# Patient Record
Sex: Female | Born: 1985 | Race: White | Hispanic: No | Marital: Single | Smoking: Current some day smoker
Health system: Southern US, Community
[De-identification: ages and names within clinical notes are randomized; demographics above are authoritative.]

## PROBLEM LIST (undated history)

## (undated) ENCOUNTER — Inpatient Hospital Stay: Payer: Self-pay

## (undated) DIAGNOSIS — Z803 Family history of malignant neoplasm of breast: Secondary | ICD-10-CM

## (undated) DIAGNOSIS — Z789 Other specified health status: Secondary | ICD-10-CM

## (undated) DIAGNOSIS — N83209 Unspecified ovarian cyst, unspecified side: Secondary | ICD-10-CM

## (undated) HISTORY — DX: Unspecified ovarian cyst, unspecified side: N83.209

## (undated) HISTORY — DX: Family history of malignant neoplasm of breast: Z80.3

## (undated) HISTORY — PX: OVARIAN CYST REMOVAL: SHX89

---
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
## 2007-07-13 ENCOUNTER — Ambulatory Visit: Payer: Self-pay | Admitting: Obstetrics and Gynecology

## 2007-08-16 ENCOUNTER — Emergency Department: Payer: Self-pay | Admitting: Emergency Medicine

## 2007-10-22 ENCOUNTER — Inpatient Hospital Stay: Payer: Self-pay

## 2007-12-16 ENCOUNTER — Observation Stay: Payer: Self-pay | Admitting: Obstetrics and Gynecology

## 2007-12-21 ENCOUNTER — Inpatient Hospital Stay: Payer: Self-pay

## 2008-02-02 ENCOUNTER — Inpatient Hospital Stay: Payer: Self-pay

## 2009-08-22 IMAGING — US US RENAL KIDNEY
1 series · 14 of 25 positions shown · non-contrast
Comparison: none

REASON FOR EXAM: back pain, pyelonephritis/recurrent UTI, pregnant
COMMENTS:

[Series 1: us renal kidney · 0.38mm/px · 14 of 29 slices shown]
[im 1/29]
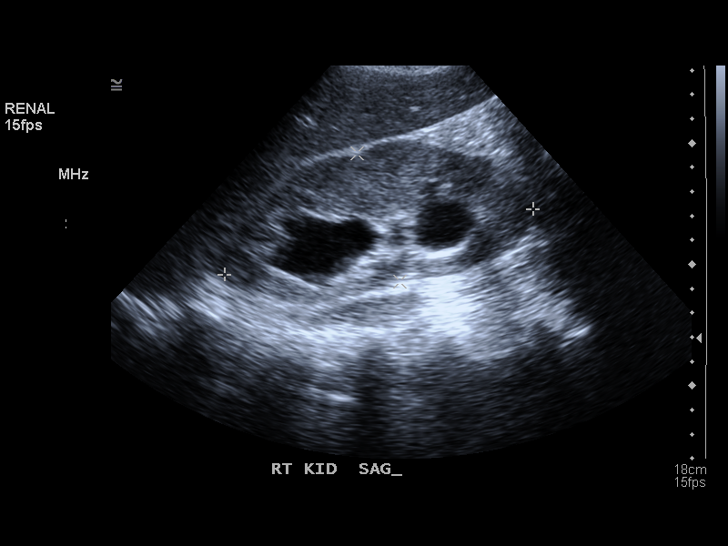
[im 3/29]
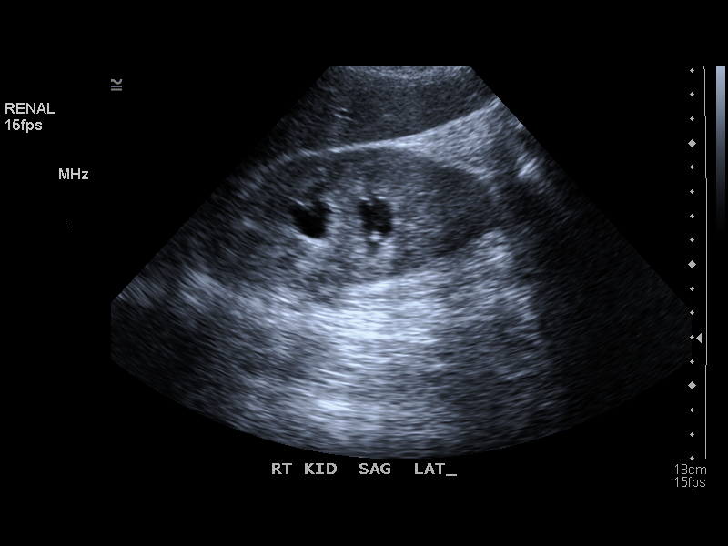
[im 5/29]
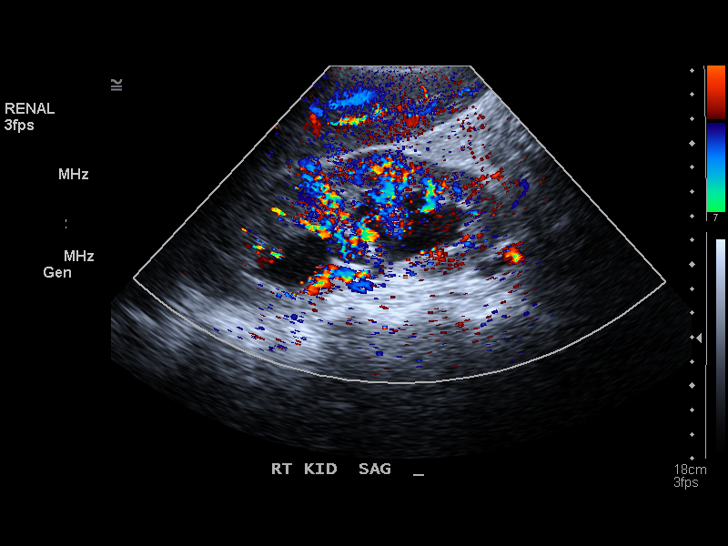
[im 8/29]
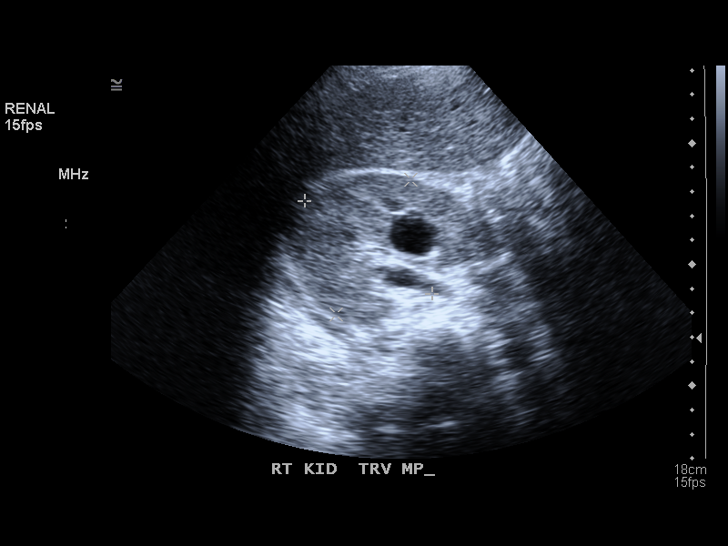
[im 10/29]
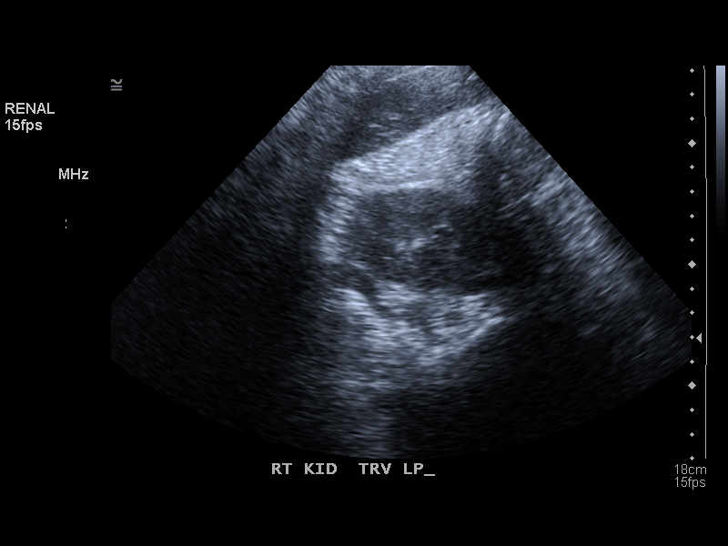
[im 11/29]
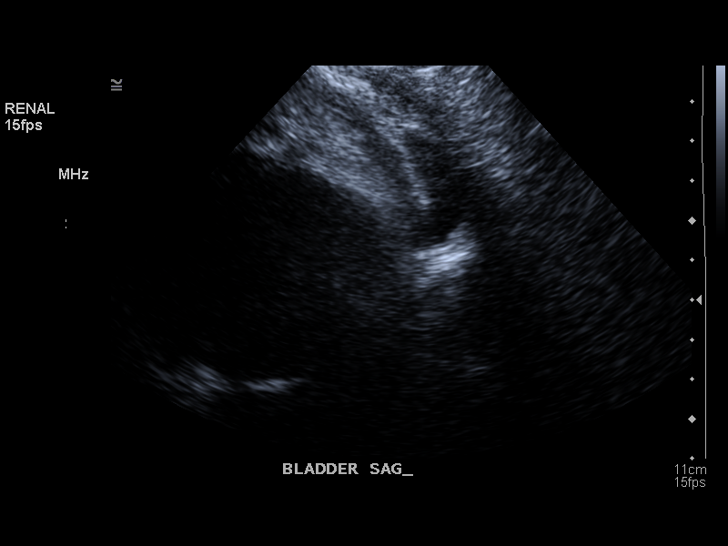
[im 13/29]
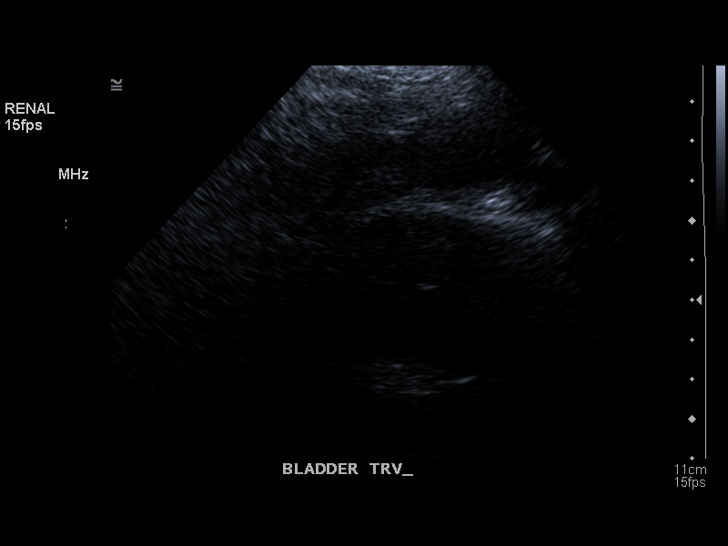
[im 16/29]
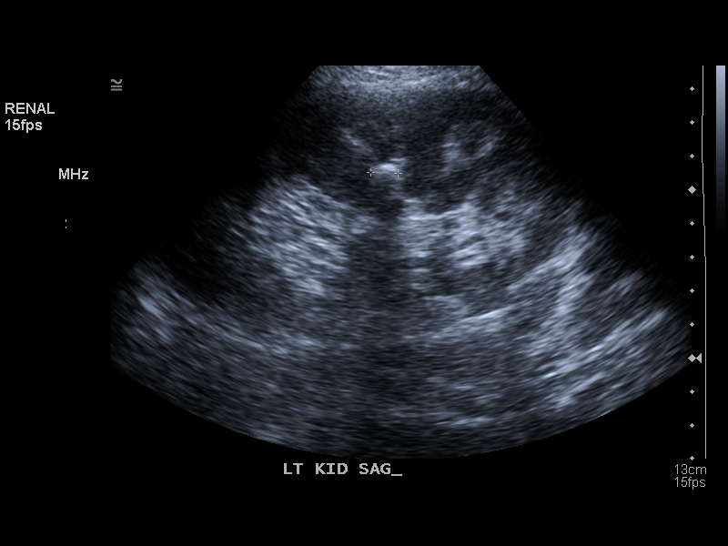
[im 18/29]
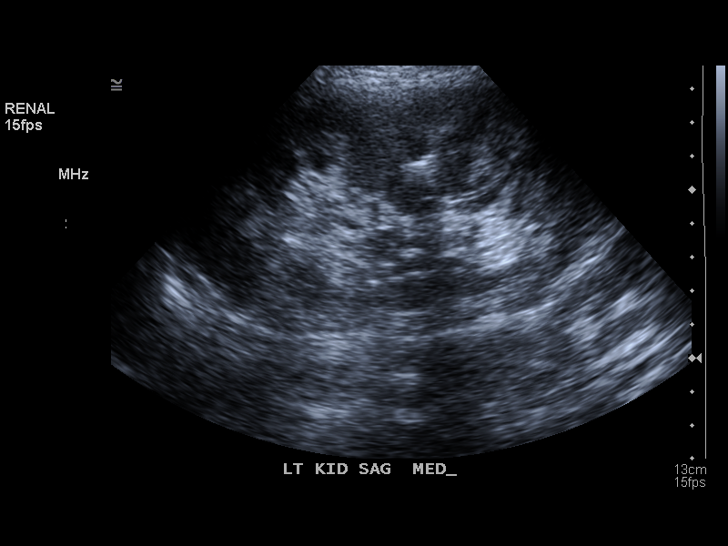
[im 19/29]
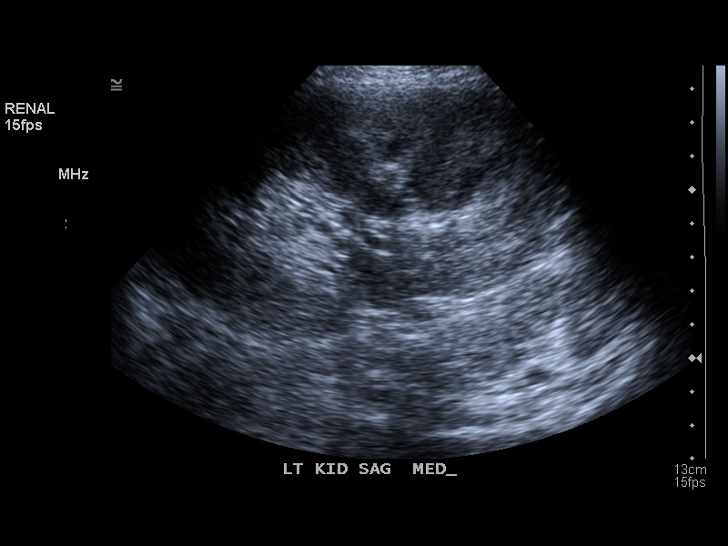
[im 22/29]
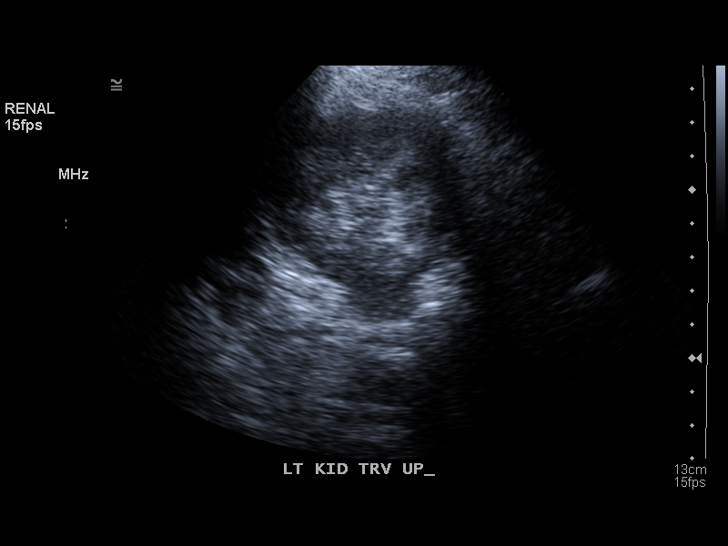
[im 24/29]
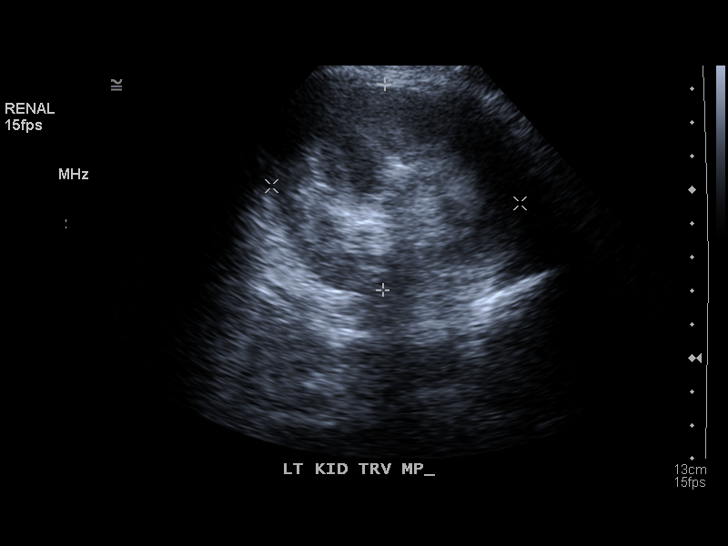
[im 26/29]
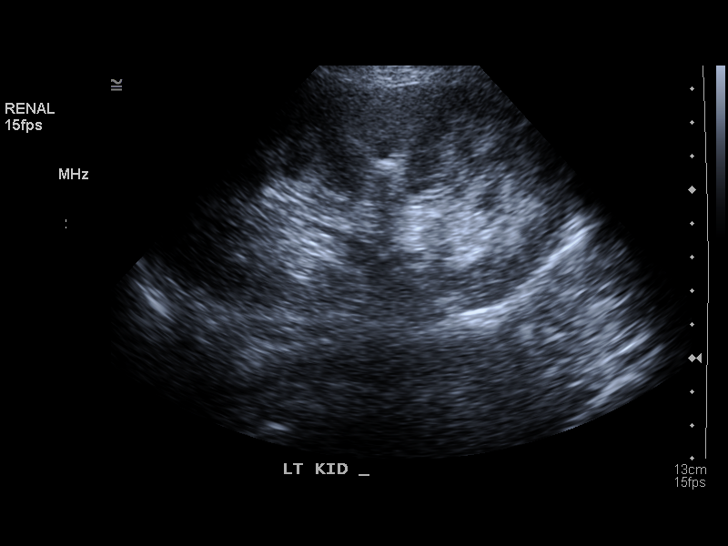
[im 29/29]
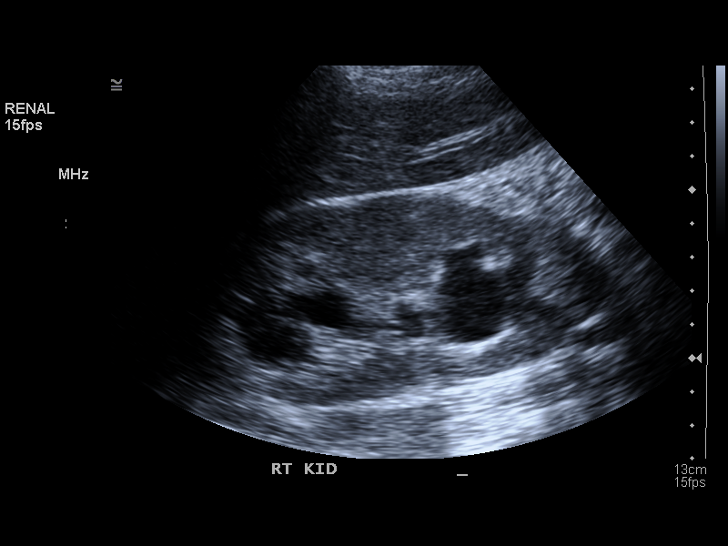

[14 of 25 positions shown; findings below may reference images not displayed]

PROCEDURE:     US  - US KIDNEY  - December 22, 2007 [DATE]

RESULT:     The patient reportedly is 35 weeks pregnant. The RIGHT kidney
measures 13 cm x 5.57 cm x 6.5 cm and the LEFT kidney measures 12.55 cm x
7.21 cm x 6.12 cm. There is moderate hydronephrosis on the RIGHT. This has
developed in the interval since the prior exam of October 26, 2007. No
hydronephrosis is seen on the LEFT. There is noted an echogenicity in the
midpole region of the LEFT kidney suspicious for a nonobstructing LEFT renal
stone.
IMPRESSION: 1. There is moderate hydronephrosis on the RIGHT. The etiology for the
hydronephrosis is not identified but may be hydronephrosis of pregnancy.
2. There is a nonobstructive stone observed in the midpole region of the
LEFT kidney.

## 2011-06-26 ENCOUNTER — Emergency Department: Payer: Self-pay | Admitting: Emergency Medicine

## 2011-06-26 LAB — URINALYSIS, COMPLETE
Bilirubin,UR: NEGATIVE
Glucose,UR: NEGATIVE mg/dL (ref 0–75)
Ph: 8 (ref 4.5–8.0)
Protein: NEGATIVE
Specific Gravity: 1.004 (ref 1.003–1.030)
Squamous Epithelial: 10
WBC UR: 3 /HPF (ref 0–5)

## 2011-06-26 LAB — COMPREHENSIVE METABOLIC PANEL
Albumin: 4 g/dL (ref 3.4–5.0)
Bilirubin,Total: 0.2 mg/dL (ref 0.2–1.0)
Calcium, Total: 9 mg/dL (ref 8.5–10.1)
Chloride: 104 mmol/L (ref 98–107)
Co2: 25 mmol/L (ref 21–32)
Creatinine: 0.84 mg/dL (ref 0.60–1.30)
EGFR (Non-African Amer.): >60
Glucose: 63 mg/dL — ABNORMAL LOW (ref 65–99)
Osmolality: 271 (ref 275–301)
SGOT(AST): 30 U/L (ref 15–37)
SGPT (ALT): 19 U/L
Sodium: 137 mmol/L (ref 136–145)
Total Protein: 8.5 g/dL — ABNORMAL HIGH (ref 6.4–8.2)

## 2011-06-26 LAB — CBC
HGB: 14.9 g/dL (ref 12.0–16.0)
MCHC: 34.1 g/dL (ref 32.0–36.0)
MCV: 91 fL (ref 80–100)
RDW: 13.5 % (ref 11.5–14.5)
WBC: 6.8 10*3/uL (ref 3.6–11.0)

## 2011-06-26 LAB — PREGNANCY, URINE: Pregnancy Test, Urine: NEGATIVE m[IU]/mL

## 2012-05-05 ENCOUNTER — Inpatient Hospital Stay: Payer: Self-pay | Admitting: Obstetrics & Gynecology

## 2012-05-05 LAB — CBC WITH DIFFERENTIAL/PLATELET
Basophil #: 0.1 10*3/uL (ref 0.0–0.1)
HCT: 33.4 % — ABNORMAL LOW (ref 35.0–47.0)
HGB: 11.3 g/dL — ABNORMAL LOW (ref 12.0–16.0)
Lymphocyte %: 15.2 %
MCHC: 33.6 g/dL (ref 32.0–36.0)
Monocyte #: 0.7 x10 3/mm (ref 0.2–0.9)
Monocyte %: 4.9 %
Neutrophil %: 78.7 %
Platelet: 269 10*3/uL (ref 150–440)
RBC: 3.7 10*6/uL — ABNORMAL LOW (ref 3.80–5.20)
RDW: 13.9 % (ref 11.5–14.5)
WBC: 15 10*3/uL — ABNORMAL HIGH (ref 3.6–11.0)

## 2012-05-06 LAB — HEMATOCRIT: HCT: 26.9 % — ABNORMAL LOW (ref 35.0–47.0)

## 2014-07-12 NOTE — H&P (Signed)
L&D Evaluation:  History:  HPI 29 year old G2 P1001 with EDC=05/06/2012 by a 12wk5days presents with LOF since 1 AM. Fluid appears clear with some white specks. No bleeding. She denies regular contractions. Decreased fetal movement x 2 days. [facility] remarkable for early prenatal care, unsure dates, smoking 1/2 PPD, S<D with a 33 week scan showing growth in the 39th %. LABS: O POS, RI, VI, GBS negative. HX of SVD in 2009 delivering a 6#12 oz female   Presents with leaking fluid   Patient's Medical History endometriosis    Patient's Surgical History Laparoscopy    Medications Pre Natal Vitamins    Allergies PCN, Sulfa, PCN-rash and N/V, Sulfa-N/V   Social History tobacco  1/2 PPD, single mother, FOB not involved with care    Family History Significant for breast and ovarian cancer    ROS:  ROS see HPI, otherwise negative   Exam:  Vital Signs stable    Urine Protein not completed   General no apparent distress   Mental Status clear    Chest clear    Heart normal sinus rhythm, no murmur/gallop/rubs   Abdomen gravid, non-tender   Estimated Fetal Weight Average for gestational age, 626-10   Fetal Position cephalic   Edema no edema    Reflexes 1+    Pelvic no external lesions, 1+/50%/-1   Mebranes Ruptured   Description clear, positive Nitrazine and positive fern   FHT baseline in the 130s with accels and moderate variaibility and occasional mild variables with quick rtn to baseline   Ucx occasional   Skin dry   Impression:  Impression IUP at 39 6/7 weeks with PROM x 10 hours   Plan:  Plan EFM/NST, Pitocin induction discussed with patient, explaining purpose and risks including FITL, hyperstimulation, C-section. She agrees with POM.   Electronic Signatures: Trinna BalloonGutierrez, Tayvien Kane L (CNM)  (Signed 04-Mar-14 13:33)  Authored: L&D Evaluation   Last Updated: 04-Mar-14 13:33 by Trinna BalloonGutierrez, Arrie Borrelli L (CNM)

## 2015-04-17 LAB — HM PAP SMEAR: HM Pap smear: NEGATIVE

## 2017-03-04 NOTE — L&D Delivery Note (Addendum)
Delivery Note Primary OB: [facility] Provider: Marcelyn Bruins, CNM Gestational Age: Full term Antepartum complications: post-term, tobacco use, marijuana use and limited prenatal care Intrapartum complications: None  A viable Female was delivered via vertex presentation at 1336. A nuchal cord was present and easily delivered through.  Delivery of the shoulders and body followed without difficulty. The infant was placed on the maternal abdomen. The umbilical cord was doubly clamped and cut following delayed cord clamping.Cord blood was collected. The placenta was delivered spontaneously and was inspected and found to be intact with a three vessel cord. The cervix and vagina were inspected. There were small hemostatic bilateral labial abrasions not requiring repair. The fundus was firm. Patient and infant were bonding in stable condition. All counts were correct.  Apgars: 8, 9  Weight:  7 lb, 11 oz.   Placenta status: spontaneous and Intact.  Cord: 3 vessels;  with the following complications: nuchal.  Anesthesia:  IV sedation Episiotomy:  none Lacerations:  labial Suture Repair: none Est. Blood Loss (mL):  400 mL  Mom to postpartum.  Baby to Couplet care / Skin to Skin.  Marcelyn Bruins, CNM [facility], [facility] 12/08/2017  2:02 PM

## 2017-05-09 LAB — OB RESULTS CONSOLE VARICELLA ZOSTER ANTIBODY, IGG: VARICELLA IGG: IMMUNE

## 2017-05-09 LAB — OB RESULTS CONSOLE ABO/RH: RH TYPE: POSITIVE

## 2017-05-09 LAB — OB RESULTS CONSOLE HGB/HCT, BLOOD
HEMATOCRIT: 38
HEMOGLOBIN: 12.5

## 2017-05-09 LAB — OB RESULTS CONSOLE PLATELET COUNT: Platelets: 291

## 2017-05-09 LAB — OB RESULTS CONSOLE RUBELLA ANTIBODY, IGM: Rubella: IMMUNE

## 2017-05-23 ENCOUNTER — Telehealth: Payer: Self-pay | Admitting: Obstetrics & Gynecology

## 2017-05-23 NOTE — Telephone Encounter (Signed)
We have received records for patient to transfer ob care. Unable to reach patient to schedule. Pt vmb not set up

## 2017-05-26 NOTE — Telephone Encounter (Signed)
Unable to reach patient to schedule. Pt vmb not set up

## 2017-06-02 ENCOUNTER — Ambulatory Visit (INDEPENDENT_AMBULATORY_CARE_PROVIDER_SITE_OTHER): Payer: Medicaid Other | Admitting: Maternal Newborn

## 2017-06-02 ENCOUNTER — Encounter: Payer: Self-pay | Admitting: Maternal Newborn

## 2017-06-02 VITALS — BP 102/62 | HR 88 | Ht 62.5 in | Wt 138.0 lb

## 2017-06-02 DIAGNOSIS — O99330 Smoking (tobacco) complicating pregnancy, unspecified trimester: Secondary | ICD-10-CM | POA: Insufficient documentation

## 2017-06-02 DIAGNOSIS — Z3480 Encounter for supervision of other normal pregnancy, unspecified trimester: Secondary | ICD-10-CM | POA: Insufficient documentation

## 2017-06-02 DIAGNOSIS — Z0189 Encounter for other specified special examinations: Secondary | ICD-10-CM

## 2017-06-02 NOTE — Progress Notes (Signed)
06/02/2017   Chief Complaint: Amenorrhea, positive home pregnancy test, desires prenatal care.  [facility]  History of Present Illness: Cheryl Mccullough is a 32 y.o. G3P2002 at 16w based on Patient's last menstrual period on 02/10/2017 (approximate), with an Estimated Date of Delivery: 11/17/2017, with the above CC.   Her periods were: irregular periods, LMP approximately 02/10/2017 lasting for 2 days, light flow. She was using no method when she conceived.  She has Positive signs or symptoms of nausea/vomiting of pregnancy. She has Negative signs or symptoms of miscarriage or preterm labor She identifies Negative Zika risk factors for her and her partner On any different medications around the time she conceived/early pregnancy: No  History of varicella: Yes   ROS: A 12-point review of systems was performed and negative, except as stated in the above HPI.  OBGYN History: As per HPI. OB History  Gravida Para Term Preterm AB Living  3 2 2     2   SAB TAB Ectopic Multiple Live Births          2    # Outcome Date GA Lbr Len/2nd Weight Sex Delivery Anes PTL Lv  3 Current           2 Term 05/05/12 5766w0d  7 lb 1 oz (3.204 kg) M Vag-Spont   LIV  1 Term 02/03/08 3234w0d  6 lb 12 oz (3.062 kg) M Vag-Spont   LIV    Any issues with any prior pregnancies: yes, recurrent UTI/pyelonephritis in G1. Any prior children are healthy, doing well, without any problems or issues: yes History of pap smears: Yes. Last pap smear 04/17/2015, NILM. History of STIs: No   Past Medical History: Past Medical History:  Diagnosis Date  . Ovarian cyst     Past Surgical History: Past Surgical History:  Procedure Laterality Date  . OVARIAN CYST REMOVAL Right     Family History:  Family History  Problem Relation Age of Onset  . Breast cancer Mother [id number]   She denies any female cancers, bleeding or blood clotting disorders.  She denies any history of  intellectual disability, birth defects or genetic disorders in her or the FOB's history.  Social History:  Social History   Socioeconomic History  . Marital status: Single    Spouse name: Not on file  . Number of children: Not on file  . Years of education: Not on file  . Highest education level: Not on file  Occupational History  . Not on file  Social Needs  . Financial resource strain: Not on file  . Food insecurity:    Worry: Not on file    Inability: Not on file  . Transportation needs:    Medical: Not on file    Non-medical: Not on file  Tobacco Use  . Smoking status: Current Some Day Smoker    Packs/day: 0.50    Types: Cigarettes  . Smokeless tobacco: Never Used  Substance and Sexual Activity  . Alcohol use: Never    Frequency: Never  . Drug use: Never  . Sexual activity: Yes    Birth control/protection: None  Lifestyle  . Physical activity:    Days per week: Not on file    Minutes per session: Not on file  . Stress: Not on file  Relationships  . Social connections:    Talks on phone: Not on file    Gets together: Not on file    Attends religious service:  Not on file    Active member of club or organization: Not on file    Attends meetings of clubs or organizations: Not on file    Relationship status: Not on file  . Intimate partner violence:    Fear of current or ex partner: Not on file    Emotionally abused: Not on file    Physically abused: Not on file    Forced sexual activity: Not on file  Other Topics Concern  . Not on file  Social History Narrative  . Not on file   Any cats in the household: no Denies history of and current domestic violence.  Allergy: Allergies  Allergen Reactions  . Penicillins Other (See Comments)    Other Reaction: Not Assessed  . Sulfa Antibiotics Other (See Comments)    Other Reaction: Not Assessed    Current Outpatient Medications: No current outpatient medications on file.   Physical Exam:   BP 102/62    Pulse 88   Ht 5' 2.5" (1.588 m)   Wt 138 lb (62.6 kg)   LMP 02/10/2017 (Approximate)   BMI 24.84 kg/m  Constitutional: Well nourished, well developed female in no acute distress.  Neck:  Supple, normal appearance, and no thyromegaly  Cardiovascular: S1, S2 normal, no murmur, rub or gallop, regular rate and rhythm Respiratory:  Clear to auscultation bilaterally. Normal respiratory effort Abdomen: gravid, no masses, hernias; diffusely non tender to palpation, non distended Breasts: breasts appear normal, no suspicious masses, no skin or nipple changes or axillary nodes, risk and benefit of breast self-exam was discussed. Neuro/Psych:  Normal mood and affect.  Skin:  Warm and dry.  Lymphatic:  No inguinal lymphadenopathy.   Pelvic exam: is not limited by body habitus External genitalia, Bartholin's glands, Urethra, Skene's glands: within normal limits Vagina: within normal limits and with no blood in the vault  Cervix: normal appearing cervix without discharge or lesions, closed/long/high Uterus:  enlarged: consistent with pregnancy Adnexa:  normal adnexa and no mass, fullness, tenderness  Assessment: Cheryl Mccullough is a 32 y.o. A5W0981 at 23 w based on Patient's last menstrual period on 02/10/2017 (approximate), with an Estimated Date of Delivery: 11/17/2017, presenting for prenatal care.  Plan:  1) Avoid alcoholic beverages. 2) Patient encouraged not to smoke. Currently smoking 0.5 ppd. Reviewed risk in pregnancy and encouraged cessation. Does not desire nicotine patch at this time. 3) Discontinue the use of all non-medicinal drugs and chemicals.  4) Take prenatal vitamins daily.  5) Seatbelt use advised 6) Nutrition, food safety (fish, cheese advisories, and high nitrite foods) and exercise discussed. 7) Hospital and practice style delivering at [facility] discussed  8) Patient is asked about travel to areas at risk for the Zika virus, and counseled to avoid travel and exposure to mosquitoes or  sexual partners who may have themselves been exposed to the virus. Testing is discussed, and will be ordered as appropriate.  9) Childbirth classes at [facility] advised 10) Genetic Screening, such as with 1st Trimester Screening, cell free fetal DNA, AFP testing, and Ultrasound, as well as with amniocentesis and CVS as appropriate, is discussed with patient. She is undecided about genetic testing this pregnancy. 11) Ultrasound ordered for dating and viability. 12) Bonjesta samples and PNV samples given. Patient to call for Rx as desired. 13) Some labs done at prior visit at [facility]. Ordered NOB labs that were not done at that visit for today.  Problem list reviewed and updated.  Return in about 1 day (around  06/03/2017) for ROB following ultrasound.  Marcelyn Bruins, CNM [facility], [facility] 06/02/2017  4:55 PM

## 2017-06-02 NOTE — Progress Notes (Signed)
Pt c/o n/v with no triggers. Pt is unsure exact dates due to hx of irregular periods. [facility]

## 2017-06-03 ENCOUNTER — Other Ambulatory Visit: Payer: Self-pay | Admitting: Maternal Newborn

## 2017-06-03 DIAGNOSIS — Z3480 Encounter for supervision of other normal pregnancy, unspecified trimester: Secondary | ICD-10-CM

## 2017-06-03 LAB — HEPATITIS B SURFACE ANTIGEN: Hepatitis B Surface Ag: NEGATIVE

## 2017-06-03 LAB — HIV ANTIBODY (ROUTINE TESTING W REFLEX): HIV Screen 4th Generation wRfx: NONREACTIVE

## 2017-06-03 LAB — RPR: RPR Ser Ql: NONREACTIVE

## 2017-06-05 LAB — URINE CULTURE

## 2017-06-05 LAB — URINE DRUG PANEL 7
Amphetamines, Urine: NEGATIVE ng/mL
Barbiturate Quant, Ur: NEGATIVE ng/mL
Benzodiazepine Quant, Ur: NEGATIVE ng/mL
CANNABINOID QUANT UR: POSITIVE — AB
COCAINE (METAB.): NEGATIVE ng/mL
OPIATE QUANT UR: NEGATIVE ng/mL
PCP QUANT UR: NEGATIVE ng/mL

## 2017-06-05 LAB — GC/CHLAMYDIA PROBE AMP
Chlamydia trachomatis, NAA: NEGATIVE
Neisseria gonorrhoeae by PCR: NEGATIVE

## 2017-06-06 ENCOUNTER — Encounter: Payer: Medicaid Other | Admitting: Obstetrics and Gynecology

## 2017-06-06 ENCOUNTER — Other Ambulatory Visit: Payer: Medicaid Other

## 2017-06-06 ENCOUNTER — Ambulatory Visit (INDEPENDENT_AMBULATORY_CARE_PROVIDER_SITE_OTHER): Payer: Medicaid Other

## 2017-06-06 ENCOUNTER — Ambulatory Visit (INDEPENDENT_AMBULATORY_CARE_PROVIDER_SITE_OTHER): Payer: Medicaid Other | Admitting: Maternal Newborn

## 2017-06-06 ENCOUNTER — Other Ambulatory Visit: Payer: Self-pay | Admitting: Maternal Newborn

## 2017-06-06 DIAGNOSIS — Z0189 Encounter for other specified special examinations: Secondary | ICD-10-CM

## 2017-06-06 DIAGNOSIS — O219 Vomiting of pregnancy, unspecified: Secondary | ICD-10-CM

## 2017-06-06 DIAGNOSIS — Z3480 Encounter for supervision of other normal pregnancy, unspecified trimester: Secondary | ICD-10-CM

## 2017-06-06 DIAGNOSIS — Z3A14 14 weeks gestation of pregnancy: Secondary | ICD-10-CM

## 2017-06-06 DIAGNOSIS — Z3689 Encounter for other specified antenatal screening: Secondary | ICD-10-CM

## 2017-06-06 MED ORDER — CEPHALEXIN 500 MG PO CAPS: 500.0000 mg | ORAL_CAPSULE | Freq: Two times a day (BID) | ORAL | 2 refills | Status: DC

## 2017-06-06 MED ORDER — DOXYLAMINE-PYRIDOXINE 10-10 MG PO TBEC: 2.0000 | DELAYED_RELEASE_TABLET | Freq: Every day | ORAL | 5 refills | Status: DC

## 2017-06-06 NOTE — Progress Notes (Signed)
No [email or web address] 

## 2017-06-07 ENCOUNTER — Encounter: Payer: Self-pay | Admitting: Maternal Newborn

## 2017-06-07 NOTE — Progress Notes (Signed)
    Routine Prenatal Care Visit  Subjective  Cheryl Mccullough is a 32 y.o. G3P2002 at 420w3d being seen today for ongoing prenatal care.  She is currently monitored for the following issues for this low-risk pregnancy and has Supervision of other normal pregnancy, antepartum and Tobacco use during pregnancy, antepartum on their problem list.  ----------------------------------------------------------------------------------- Patient reports occasional nausea and vomiting. Bonjesta samples worked well. Sent Rx for Diclegis. Contractions: Not present. Vag. Bleeding: None.  Movement: Absent. Denies leaking of fluid.  ----------------------------------------------------------------------------------- The following portions of the patient's history were reviewed and updated as appropriate: allergies, current medications, past family history, past medical history, past social history, past surgical history and problem list. Problem list updated.   Objective  Blood pressure 100/70, weight 137 lb (62.1 kg), last menstrual period 02/10/2017. Pregravid weight 128 lb (58.1 kg) Total Weight Gain 9 lb (4.082 kg) Urinalysis:      Fetal Status: Fetal Heart Rate (bpm): 153   Movement: Absent     General:  Alert, oriented and cooperative. Patient is in no acute distress.  Skin: Skin is warm and dry. No rash noted.   Cardiovascular: Normal heart rate noted  Respiratory: Normal respiratory effort, no problems with respiration noted  Abdomen: Soft, gravid, appropriate for gestational age. Pain/Pressure: Absent     Pelvic:  Cervical exam deferred        Extremities: Normal range of motion.     Mental Status: Normal mood and affect. Normal behavior. Normal judgment and thought content.     Assessment   32 y.o. [id number] at 3120w3d, EDD 12/02/2017 by Ultrasound presenting for routine prenatal visit.  Plan   pregnancy #3 Problems (from 06/02/17 to present)    Problem Noted Resolved   Supervision of other normal  pregnancy, antepartum 06/02/2017 by Oswaldo ConroySchmid, Brittny Spangle Y, CNM No   Overview Addendum 06/03/2017  2:14 PM by Oswaldo ConroySchmid, Akeelah Seppala Y, CNM    Clinic [facility] Prenatal Labs  Dating  Blood type: O/Positive/-- (03/08 0000)   Genetic Screen 1 Screen:    AFP:     Quad:     NIPS: Antibody:   Anatomic US  Rubella: Immune (03/08 0000) Varicella: Immune (03/08 0000)  GTT Early:               Third trimester:  RPR: Non Reactive (04/01 1529)   Rhogam  HBsAg: Negative (04/01 1529)   TDaP vaccine                       Flu Shot: HIV: Non Reactive (04/01 1529)   Baby Food                                GBS:   Contraception  Pap: 04/17/2015, NILM  CBB     CS/VBAC    Support Person                  Ultrasound shows single IUP with cardiac activity. Dating changed today based on this ultrasound dating with 2w 1d difference from LMP.  Gestational age appropriate obstetric precautions were reviewed with the patient.  Return in about 1 month (around 07/04/2017) for ROB and anatomy scan.  Cheryl Mccullough, CNM 06/07/2017  10:35 PM

## 2017-07-04 ENCOUNTER — Ambulatory Visit (INDEPENDENT_AMBULATORY_CARE_PROVIDER_SITE_OTHER): Payer: Medicaid Other

## 2017-07-04 ENCOUNTER — Encounter: Payer: Self-pay | Admitting: Advanced Practice Midwife

## 2017-07-04 ENCOUNTER — Ambulatory Visit (INDEPENDENT_AMBULATORY_CARE_PROVIDER_SITE_OTHER): Payer: Medicaid Other | Admitting: Advanced Practice Midwife

## 2017-07-04 VITALS — BP 110/72 | Wt 136.0 lb

## 2017-07-04 DIAGNOSIS — Z3689 Encounter for other specified antenatal screening: Secondary | ICD-10-CM

## 2017-07-04 DIAGNOSIS — Z0489 Encounter for examination and observation for other specified reasons: Secondary | ICD-10-CM

## 2017-07-04 DIAGNOSIS — IMO0002 Reserved for concepts with insufficient information to code with codable children: Secondary | ICD-10-CM

## 2017-07-04 DIAGNOSIS — Z3A18 18 weeks gestation of pregnancy: Secondary | ICD-10-CM | POA: Diagnosis not present

## 2017-07-04 DIAGNOSIS — Z3480 Encounter for supervision of other normal pregnancy, unspecified trimester: Secondary | ICD-10-CM

## 2017-07-04 NOTE — Progress Notes (Signed)
  Routine Prenatal Care Visit  Subjective  Cheryl Mccullough is a 32 y.o. G3P2002 at [redacted]w[redacted]d being seen today for ongoing prenatal care.  She is currently monitored for the following issues for this low-risk pregnancy and has Supervision of other normal pregnancy, antepartum and Tobacco use during pregnancy, antepartum on their problem list.  ----------------------------------------------------------------------------------- Patient reports no complaints.   Contractions: Not present. Vag. Bleeding: None.  Movement: Present. Denies leaking of fluid.  ----------------------------------------------------------------------------------- The following portions of the patient's history were reviewed and updated as appropriate: allergies, current medications, past family history, past medical history, past social history, past surgical history and problem list. Problem list updated.   Objective  Blood pressure 110/72, weight 136 lb (61.7 kg), last menstrual period 02/10/2017. Pregravid weight 128 lb (58.1 kg) Total Weight Gain 8 lb (3.629 kg) Urinalysis:      Fetal Status: Fetal Heart Rate (bpm): 151   Movement: Present     Anatomy scan today is incomplete for: spine, face, diaphragm, cardiac and sup-optimal for brain structure due to fetal position  General:  Alert, oriented and cooperative. Patient is in no acute distress.  Skin: Skin is warm and dry. No rash noted.   Cardiovascular: Normal heart rate noted  Respiratory: Normal respiratory effort, no problems with respiration noted  Abdomen: Soft, gravid, appropriate for gestational age. Pain/Pressure: Absent     Pelvic:  Cervical exam deferred        Extremities: Normal range of motion.  Edema: None  Mental Status: Normal mood and affect. Normal behavior. Normal judgment and thought content.   Assessment   32 y.o. [id number] at [redacted]w[redacted]d by  12/02/2017, by Ultrasound presenting for routine prenatal visit  Plan   pregnancy #3 Problems (from 06/02/17 to  present)    Problem Noted Resolved   Supervision of other normal pregnancy, antepartum 06/02/2017 by Oswaldo Conroy, CNM No   Overview Addendum 06/03/2017  2:14 PM by Oswaldo Conroy, CNM    Clinic [facility] Prenatal Labs  Dating  Blood type: O/Positive/-- (03/08 0000)   Genetic Screen 1 Screen:    AFP:     Quad:     NIPS: Antibody:   Anatomic [place]  Rubella: Immune (03/08 0000) Varicella: Immune (03/08 0000)  GTT Early:               Third trimester:  RPR: Non Reactive (04/01 1529)   Rhogam  HBsAg: Negative (04/01 1529)   TDaP vaccine                       Flu Shot: HIV: Non Reactive (04/01 1529)   Baby Food                                GBS:   Contraception  Pap: 04/17/2015, NILM  CBB     CS/VBAC    Support Person                  Preterm labor symptoms and general obstetric precautions including but not limited to vaginal bleeding, contractions, leaking of fluid and fetal movement were reviewed in detail with the patient.   Return in about 1 month (around 08/01/2017) for f/u anatomy scan and rob.  Tresea Mall, CNM 07/04/2017 11:14 AM

## 2017-07-04 NOTE — Progress Notes (Signed)
U/s today.  Pt had stomach bug this week. No vb. No lof.

## 2017-08-04 ENCOUNTER — Other Ambulatory Visit: Payer: Medicaid Other

## 2017-08-04 ENCOUNTER — Encounter: Payer: Medicaid Other | Admitting: Maternal Newborn

## 2017-09-02 ENCOUNTER — Other Ambulatory Visit: Payer: Medicaid Other

## 2017-09-02 ENCOUNTER — Encounter: Payer: Medicaid Other | Admitting: Certified Nurse Midwife

## 2017-09-03 ENCOUNTER — Encounter: Payer: Self-pay | Admitting: Obstetrics and Gynecology

## 2017-09-03 ENCOUNTER — Ambulatory Visit (INDEPENDENT_AMBULATORY_CARE_PROVIDER_SITE_OTHER): Payer: Medicaid Other | Admitting: Obstetrics and Gynecology

## 2017-09-03 ENCOUNTER — Ambulatory Visit (INDEPENDENT_AMBULATORY_CARE_PROVIDER_SITE_OTHER): Payer: Medicaid Other

## 2017-09-03 VITALS — BP 100/62 | Wt 147.0 lb

## 2017-09-03 DIAGNOSIS — Z0489 Encounter for examination and observation for other specified reasons: Secondary | ICD-10-CM

## 2017-09-03 DIAGNOSIS — O99330 Smoking (tobacco) complicating pregnancy, unspecified trimester: Secondary | ICD-10-CM

## 2017-09-03 DIAGNOSIS — Z3482 Encounter for supervision of other normal pregnancy, second trimester: Secondary | ICD-10-CM | POA: Diagnosis not present

## 2017-09-03 DIAGNOSIS — F17210 Nicotine dependence, cigarettes, uncomplicated: Secondary | ICD-10-CM

## 2017-09-03 DIAGNOSIS — Z3480 Encounter for supervision of other normal pregnancy, unspecified trimester: Secondary | ICD-10-CM

## 2017-09-03 DIAGNOSIS — Z3A27 27 weeks gestation of pregnancy: Secondary | ICD-10-CM | POA: Diagnosis not present

## 2017-09-03 DIAGNOSIS — O99332 Smoking (tobacco) complicating pregnancy, second trimester: Secondary | ICD-10-CM

## 2017-09-03 NOTE — Progress Notes (Signed)
Routine Prenatal Care Visit  Subjective  Cheryl Mccullough is a 32 y.o. G3P2002 at 5541w1d being seen today for ongoing prenatal care.  She is currently monitored for the following issues for this low-risk pregnancy and has Supervision of other normal pregnancy, antepartum and Tobacco use during pregnancy, antepartum on their problem list.  ----------------------------------------------------------------------------------- Patient reports no complaints.   Contractions: Not present. Vag. Bleeding: None.  Movement: Present. Denies leaking of fluid.  ----------------------------------------------------------------------------------- The following portions of the patient's history were reviewed and updated as appropriate: allergies, current medications, past family history, past medical history, past social history, past surgical history and problem list. Problem list updated.   Objective  Blood pressure 100/62, weight 147 lb (66.7 kg), last menstrual period 02/10/2017. Pregravid weight 128 lb (58.1 kg) Total Weight Gain 19 lb (8.618 kg) Urinalysis: Urine Protein: Negative Urine Glucose: Negative  Fetal Status: Fetal Heart Rate (bpm): 145 Fundal Height: 27 cm Movement: Present     General:  Alert, oriented and cooperative. Patient is in no acute distress.  Skin: Skin is warm and dry. No rash noted.   Cardiovascular: Normal heart rate noted  Respiratory: Normal respiratory effort, no problems with respiration noted  Abdomen: Soft, gravid, appropriate for gestational age. Pain/Pressure: Absent     Pelvic:  Cervical exam deferred        Extremities: Normal range of motion.     ental Status: Normal mood and affect. Normal behavior. Normal judgment and thought content.     Assessment   32 y.o. [id number] at 3241w1d by  12/02/2017, by Ultrasound presenting for routine prenatal visit  Plan   pregnancy #3 Problems (from 06/02/17 to present)    Problem Noted Resolved   Supervision of other normal  pregnancy, antepartum 06/02/2017 by Oswaldo ConroySchmid, Jacelyn Y, CNM No   Overview Addendum 09/03/2017 11:48 AM by Natale MilchSchuman, Micayla Brathwaite R, MD    Clinic [facility] Prenatal Labs  Dating  Blood type: O/Positive/-- (03/08 0000)   Genetic Screen 1 Screen:    AFP:     Quad:     NIPS: Antibody:   Anatomic US  Rubella: Immune (03/08 0000) Varicella: Immune (03/08 0000)  GTT Early:               Third trimester:  RPR: Non Reactive (04/01 1529)   Rhogam  not needed HBsAg: Negative (04/01 1529)   TDaP vaccine                        Flu Shot: HIV: Non Reactive (04/01 1529)   Baby Food                                GBS:   Contraception  Pap: 04/17/2015, NILM  CBB     CS/VBAC    Support Person                  Gestational age appropriate obstetric precautions including but not limited to vaginal bleeding, contractions, leaking of fluid and fetal movement were reviewed in detail with the patient.    Given information on birthcontrol options postpartum. Recommended [email or web address].  She is undecided about method, considering IUD or nexplanon. Discussed breast feeding. Patient is planning on bottle feeding.  Urine culture sent to confirm that she has cleared her previous infection.  Message sent to Dorena DewMarilyn Steele about patient.   Return in about 2 days (around 09/05/2017) for 1 GTT  and ROB.  Adelene Idler MD [facility], [facility] 09/03/17 12:03 PM

## 2017-09-03 NOTE — Progress Notes (Signed)
ROB  °Anatomy scan °

## 2017-09-05 ENCOUNTER — Other Ambulatory Visit: Payer: Self-pay | Admitting: Obstetrics and Gynecology

## 2017-09-05 DIAGNOSIS — N390 Urinary tract infection, site not specified: Secondary | ICD-10-CM

## 2017-09-05 LAB — URINE CULTURE

## 2017-09-05 MED ORDER — NITROFURANTOIN MONOHYD MACRO 100 MG PO CAPS: 100.0000 mg | ORAL_CAPSULE | Freq: Two times a day (BID) | ORAL | 1 refills | Status: DC

## 2017-09-05 NOTE — Progress Notes (Signed)
Pt was not aware she had a rx, so pt told she has one and had a positive urine culture

## 2017-09-05 NOTE — Progress Notes (Signed)
I sent a prescription for her to the pharmacy, but she did not answer. Can you please call her and confirm she picked up the perscription.

## 2017-09-09 ENCOUNTER — Encounter: Payer: Medicaid Other | Admitting: Certified Nurse Midwife

## 2017-09-09 ENCOUNTER — Other Ambulatory Visit: Payer: Medicaid Other

## 2017-11-04 ENCOUNTER — Observation Stay
Admission: EM | Admit: 2017-11-04 | Discharge: 2017-11-04 | Disposition: A | Discharge status: Home / Self Care | Payer: Medicaid Other | Attending: Obstetrics & Gynecology | Admitting: Obstetrics & Gynecology

## 2017-11-04 ENCOUNTER — Other Ambulatory Visit: Payer: Self-pay

## 2017-11-04 DIAGNOSIS — O0933 Supervision of pregnancy with insufficient antenatal care, third trimester: Secondary | ICD-10-CM | POA: Insufficient documentation

## 2017-11-04 DIAGNOSIS — O4703 False labor before 37 completed weeks of gestation, third trimester: Secondary | ICD-10-CM

## 2017-11-04 DIAGNOSIS — Z3A36 36 weeks gestation of pregnancy: Secondary | ICD-10-CM | POA: Diagnosis not present

## 2017-11-04 DIAGNOSIS — Z3A35 35 weeks gestation of pregnancy: Secondary | ICD-10-CM

## 2017-11-04 DIAGNOSIS — Z3480 Encounter for supervision of other normal pregnancy, unspecified trimester: Secondary | ICD-10-CM

## 2017-11-04 DIAGNOSIS — O2343 Unspecified infection of urinary tract in pregnancy, third trimester: Secondary | ICD-10-CM | POA: Diagnosis not present

## 2017-11-04 DIAGNOSIS — O99333 Smoking (tobacco) complicating pregnancy, third trimester: Secondary | ICD-10-CM | POA: Diagnosis not present

## 2017-11-04 LAB — URINE DRUG SCREEN, QUALITATIVE (ARMC ONLY)
Amphetamines, Ur Screen: NOT DETECTED
Barbiturates, Ur Screen: NOT DETECTED
COCAINE METABOLITE, UR ~~LOC~~: NOT DETECTED
Cannabinoid 50 Ng, Ur ~~LOC~~: POSITIVE — AB
MDMA (ECSTASY) UR SCREEN: NOT DETECTED
Methadone Scn, Ur: NOT DETECTED
OPIATE, UR SCREEN: NOT DETECTED
PHENCYCLIDINE (PCP) UR S: NOT DETECTED
TRICYCLIC, UR SCREEN: NOT DETECTED

## 2017-11-04 LAB — OB RESULTS CONSOLE GBS: GBS: NEGATIVE

## 2017-11-04 LAB — GROUP B STREP BY PCR: Group B strep by PCR: NEGATIVE

## 2017-11-04 NOTE — Discharge Instructions (Signed)
Braxton Hicks Contractions °Contractions of the uterus can occur throughout pregnancy, but they are not always a sign that you are in labor. You may have practice contractions called Braxton Hicks contractions. These false labor contractions are sometimes confused with true labor. °What are Braxton Hicks contractions? °Braxton Hicks contractions are tightening movements that occur in the muscles of the uterus before labor. Unlike true labor contractions, these contractions do not result in opening (dilation) and thinning of the cervix. Toward the end of pregnancy (32-34 weeks), Braxton Hicks contractions can happen more often and may become stronger. These contractions are sometimes difficult to tell apart from true labor because they can be very uncomfortable. You should not feel embarrassed if you go to the hospital with false labor. °Sometimes, the only way to tell if you are in true labor is for your health care provider to look for changes in the cervix. The health care provider will do a physical exam and may monitor your contractions. If you are not in true labor, the exam should show that your cervix is not dilating and your water has not broken. °If there are other health problems associated with your pregnancy, it is completely safe for you to be sent home with false labor. You may continue to have Braxton Hicks contractions until you go into true labor. °How to tell the difference between true labor and false labor °True labor °· Contractions last 30-70 seconds. °· Contractions become very regular. °· Discomfort is usually felt in the top of the uterus, and it spreads to the lower abdomen and low back. °· Contractions do not go away with walking. °· Contractions usually become more intense and increase in frequency. °· The cervix dilates and gets thinner. °False labor °· Contractions are usually shorter and not as strong as true labor contractions. °· Contractions are usually irregular. °· Contractions  are often felt in the front of the lower abdomen and in the groin. °· Contractions may go away when you walk around or change positions while lying down. °· Contractions get weaker and are shorter-lasting as time goes on. °· The cervix usually does not dilate or become thin. °Follow these instructions at home: °· Take over-the-counter and prescription medicines only as told by your health care provider. °· Keep up with your usual exercises and follow other instructions from your health care provider. °· Eat and drink lightly if you think you are going into labor. °· If Braxton Hicks contractions are making you uncomfortable: °? Change your position from lying down or resting to walking, or change from walking to resting. °? Sit and rest in a tub of warm water. °? Drink enough fluid to keep your urine pale yellow. Dehydration may cause these contractions. °? Do slow and deep breathing several times an hour. °· Keep all follow-up prenatal visits as told by your health care provider. This is important. °Contact a health care provider if: °· You have a fever. °· You have continuous pain in your abdomen. °Get help right away if: °· Your contractions become stronger, more regular, and closer together. °· You have fluid leaking or gushing from your vagina. °· You pass blood-tinged mucus (bloody show). °· You have bleeding from your vagina. °· You have low back pain that you never had before. °· You feel your baby’s head pushing down and causing pelvic pressure. °· Your baby is not moving inside you as much as it used to. °Summary °· Contractions that occur before labor are called Braxton   Hicks contractions, false labor, or practice contractions. °· Braxton Hicks contractions are usually shorter, weaker, farther apart, and less regular than true labor contractions. True labor contractions usually become progressively stronger and regular and they become more frequent. °· Manage discomfort from Braxton Hicks contractions by  changing position, resting in a warm bath, drinking plenty of water, or practicing deep breathing. °This information is not intended to replace advice given to you by your health care provider. Make sure you discuss any questions you have with your health care provider. °Document Released: 07/04/2016 Document Revised: 07/04/2016 Document Reviewed: 07/04/2016 °Elsevier Interactive Patient Education © 2018 [employer] ° °

## 2017-11-04 NOTE — Discharge Summary (Signed)
Physician Final Progress Note  Patient ID: Cheryl Mccullough MRN: [id number] DOB/AGE: 03/08/1985 32 y.o.  Admit date: 11/04/2017 Admitting provider: Nadara Mustard, MD Discharge date: 11/04/2017   Admission Diagnoses: contractions  Discharge Diagnoses:  Active Problems:   Indication for care in labor and delivery, antepartum 32 yo G3 P2002 at 36 weeks 0 days with infrequent contractions, not in labor, reactive NST, insufficient prenatal care  History of Present Illness: The patient is a 32 y.o. female G3P2002 at [redacted]w[redacted]d who presents for contractions she has had since yesterday. She reports they were every hour yesterday. Since this morning she has been feeling them every 25 minutes. She reports fetal movement. She denies leakage of fluid or vaginal bleeding. She has not been seen at the clinic for the past 2 months  Past Medical History:  Diagnosis Date  . Ovarian cyst     Past Surgical History:  Procedure Laterality Date  . OVARIAN CYST REMOVAL Right     No current facility-administered medications on file prior to encounter.    No current outpatient medications on file prior to encounter.    Allergies  Allergen Reactions  . Penicillins Other (See Comments)    Other Reaction: Not Assessed  . Sulfa Antibiotics Other (See Comments)    Other Reaction: Not Assessed  . Wellbutrin [Bupropion]     Social History   Socioeconomic History  . Marital status: Single    Spouse name: Not on file  . Number of children: Not on file  . Years of education: Not on file  . Highest education level: Not on file  Occupational History  . Not on file  Social Needs  . Financial resource strain: Not on file  . Food insecurity:    Worry: Not on file    Inability: Not on file  . Transportation needs:    Medical: Not on file    Non-medical: Not on file  Tobacco Use  . Smoking status: Current Some Day Smoker    Packs/day: 0.50    Types: Cigarettes  . Smokeless tobacco: Never Used  Substance  and Sexual Activity  . Alcohol use: Never    Frequency: Never  . Drug use: Never  . Sexual activity: Yes    Birth control/protection: Pill  Lifestyle  . Physical activity:    Days per week: Not on file    Minutes per session: Not on file  . Stress: Not on file  Relationships  . Social connections:    Talks on phone: Not on file    Gets together: Not on file    Attends religious service: Not on file    Active member of club or organization: Not on file    Attends meetings of clubs or organizations: Not on file    Relationship status: Not on file  . Intimate partner violence:    Fear of current or ex partner: Not on file    Emotionally abused: Not on file    Physically abused: Not on file    Forced sexual activity: Not on file  Other Topics Concern  . Not on file  Social History Narrative  . Not on file    Physical Exam: BP 117/69 (BP Location: Left Arm)   Pulse 77   Temp 98.1 F (36.7 C) (Oral)   Resp 16   Ht 5\' 3"  (1.6 m)   Wt 67.1 kg   LMP 02/10/2017 (Approximate)   BMI 26.22 kg/m   Gen: NAD CV: RRR Pulm: CTAB Pelvic:  external os FT/thick/-3 per RN Cheryl Mccullough on admission and prior to discharge Toco: rare contractions Fetal well being: 130 bpm baseline, moderate variability, +accelerations, -decelerations Ext: no evidence of DVT  Consults: None  Significant Findings/ Diagnostic Studies: labs:   Results for Cheryl Mccullough (MRN [id number]) as of 11/04/2017 15:20  Ref. Range 11/04/2017 11:56  Amphetamines, Ur Screen Latest Ref Range: NONE DETECTED  NONE DETECTED  Barbiturates, Ur Screen Latest Ref Range: NONE DETECTED  NONE DETECTED  Benzodiazepine, Ur Scrn Latest Ref Range: NONE DETECTED  TEST NOT PERFORMED, REAGENT NOT AVAILABLE (A)  Cocaine Metabolite,Ur [place] Latest Ref Range: NONE DETECTED  NONE DETECTED  Methadone Scn, Ur Latest Ref Range: NONE DETECTED  NONE DETECTED  MDMA (Ecstasy)Ur Screen Latest Ref Range: NONE DETECTED  NONE DETECTED  Cannabinoid 50 Ng, Ur  [place] Latest Ref Range: NONE DETECTED  POSITIVE (A)  Opiate, Ur Screen Latest Ref Range: NONE DETECTED  NONE DETECTED  Phencyclidine (PCP) Ur S Latest Ref Range: NONE DETECTED  NONE DETECTED  Tricyclic, Ur Screen Latest Ref Range: NONE DETECTED  NONE DETECTED  URINE CULTURE Unknown pending   GBS result pending  Procedures: NST  Discharge Condition: good  Disposition: Discharge disposition: 01-Home or Self Care      Diet: Regular diet, encouraged increased hydration  Discharge Activity: Activity as tolerated  Discharge Instructions    Discharge activity:  No Restrictions   Complete by:  As directed    Discharge diet:  No restrictions   Complete by:  As directed    Fetal Kick Count:  Lie on our left side for one hour after a meal, and count the number of times your baby kicks.  If it is less than 5 times, get up, move around and drink some juice.  Repeat the test 30 minutes later.  If it is still less than 5 kicks in an hour, notify your doctor.   Complete by:  As directed    No sexual activity restrictions   Complete by:  As directed    Notify physician for a general feeling that "something is not right"   Complete by:  As directed    Notify physician for increase or change in vaginal discharge   Complete by:  As directed    Notify physician for intestinal cramps, with or without diarrhea, sometimes described as "gas pain"   Complete by:  As directed    Notify physician for leaking of fluid   Complete by:  As directed    Notify physician for low, dull backache, unrelieved by heat or Tylenol   Complete by:  As directed    Notify physician for menstrual like cramps   Complete by:  As directed    Notify physician for pelvic pressure   Complete by:  As directed    Notify physician for uterine contractions.  These may be painless and feel like the uterus is tightening or the baby is  "balling up"   Complete by:  As directed    Notify physician for vaginal bleeding   Complete by:   As directed    PRETERM LABOR:  Includes any of the follwing symptoms that occur between 20 - [redacted] weeks gestation.  If these symptoms are not stopped, preterm labor can result in preterm delivery, placing your baby at risk   Complete by:  As directed      Allergies as of 11/04/2017      Reactions   Penicillins Other (See Comments)   Other Reaction:  Not Assessed   Sulfa Antibiotics Other (See Comments)   Other Reaction: Not Assessed   Wellbutrin [bupropion]       Medication List    You have not been prescribed any medications.    Follow-up Information    [facility]. Schedule an appointment as soon as possible for a visit in 1 week(s).   Specialty:  Obstetrics and Gynecology Why:  for prenatal care Contact information: [address] [place] [address] [phone]          Total time spent taking care of this patient: 20 minutes  Signed: Tresea Mall, CNM  11/04/2017, 3:04 PM

## 2017-11-05 ENCOUNTER — Other Ambulatory Visit: Payer: Self-pay | Admitting: Obstetrics & Gynecology

## 2017-11-05 MED ORDER — NITROFURANTOIN MONOHYD MACRO 100 MG PO CAPS: 100.0000 mg | ORAL_CAPSULE | Freq: Two times a day (BID) | ORAL | 0 refills | Status: DC

## 2017-11-05 NOTE — Progress Notes (Signed)
Mailbox full can not leave message, will try again later

## 2017-11-05 NOTE — Progress Notes (Signed)
Let her know Urine Culture came back Pos for Infection, Rx sent for Macrobid Antibiotic to [facility] to start taking twice a day (today)

## 2017-11-06 LAB — URINE CULTURE: Culture: >=100000 — AB

## 2017-11-07 NOTE — Progress Notes (Signed)
Pt still not available and mail box full

## 2017-11-10 NOTE — Progress Notes (Signed)
Mailbox still full.

## 2017-12-01 ENCOUNTER — Other Ambulatory Visit: Payer: Self-pay

## 2017-12-01 ENCOUNTER — Observation Stay
Admission: EM | Admit: 2017-12-01 | Discharge: 2017-12-01 | Disposition: A | Discharge status: Home / Self Care | Payer: Medicaid Other | Attending: Obstetrics and Gynecology | Admitting: Obstetrics and Gynecology

## 2017-12-01 DIAGNOSIS — F17210 Nicotine dependence, cigarettes, uncomplicated: Secondary | ICD-10-CM | POA: Diagnosis not present

## 2017-12-01 DIAGNOSIS — Z3480 Encounter for supervision of other normal pregnancy, unspecified trimester: Secondary | ICD-10-CM

## 2017-12-01 DIAGNOSIS — O471 False labor at or after 37 completed weeks of gestation: Secondary | ICD-10-CM | POA: Diagnosis present

## 2017-12-01 DIAGNOSIS — O0933 Supervision of pregnancy with insufficient antenatal care, third trimester: Secondary | ICD-10-CM | POA: Insufficient documentation

## 2017-12-01 DIAGNOSIS — O99333 Smoking (tobacco) complicating pregnancy, third trimester: Secondary | ICD-10-CM | POA: Diagnosis not present

## 2017-12-01 DIAGNOSIS — Z3A40 40 weeks gestation of pregnancy: Secondary | ICD-10-CM | POA: Insufficient documentation

## 2017-12-01 NOTE — OB Triage Note (Signed)
Pt complains of contractions for past 3-4 hours, coming eery 40 minutes lasting 45 seconds in length. Denies lof, vaginal bleeding. Baby moving appropriately

## 2017-12-02 NOTE — Discharge Summary (Signed)
See Final Progress Note, 12/01/2017.

## 2017-12-02 NOTE — Final Progress Note (Signed)
Physician Final Progress Note  Patient ID: Cheryl Mccullough MRN: [id number] DOB/AGE: [birth year] 32 y.o.  Admit date: 12/01/2017 Admitting provider: Vena Austria, MD Discharge date: 12/02/2017   Admission Diagnoses: Indication for care in labor and delivery, antepartum  Discharge Diagnoses:  Limited prenatal care  History of Present Illness: The patient is a 32 y.o. female G3P2002 at [redacted]w[redacted]d who presents for some contractions over the last 4 hours, about every 40 minutes lasting 45 seconds. No vaginal bleeding or loss of fluid. Endorses good fetal movement. Has not been seen in the office since July 2019.  Review of Systems: Review of systems negative unless otherwise noted in HPI.   Past Medical History:  Diagnosis Date  . Ovarian cyst     Past Surgical History:  Procedure Laterality Date  . OVARIAN CYST REMOVAL Right     No current facility-administered medications on file prior to encounter.    No current outpatient medications on file prior to encounter.    Allergies  Allergen Reactions  . Penicillins Other (See Comments)    Other Reaction: Not Assessed  . Sulfa Antibiotics Other (See Comments)    Other Reaction: Not Assessed  . Wellbutrin [Bupropion]     Social History   Socioeconomic History  . Marital status: Single    Spouse name: Not on file  . Number of children: Not on file  . Years of education: Not on file  . Highest education level: Not on file  Occupational History  . Not on file  Social Needs  . Financial resource strain: Not on file  . Food insecurity:    Worry: Not on file    Inability: Not on file  . Transportation needs:    Medical: Not on file    Non-medical: Not on file  Tobacco Use  . Smoking status: Current Some Day Smoker    Packs/day: 0.50    Types: Cigarettes  . Smokeless tobacco: Never Used  Substance and Sexual Activity  . Alcohol use: Never    Frequency: Never  . Drug use: Never  . Sexual activity: Yes    Birth  control/protection: Pill  Lifestyle  . Physical activity:    Days per week: Not on file    Minutes per session: Not on file  . Stress: Not on file  Relationships  . Social connections:    Talks on phone: Not on file    Gets together: Not on file    Attends religious service: Not on file    Active member of club or organization: Not on file    Attends meetings of clubs or organizations: Not on file    Relationship status: Not on file  . Intimate partner violence:    Fear of current or ex partner: Not on file    Emotionally abused: Not on file    Physically abused: Not on file    Forced sexual activity: Not on file  Other Topics Concern  . Not on file  Social History Narrative  . Not on file    Family history: Negative/unremarkable except as detailed in HPI. No family history of birth defects.   Physical Exam: BP 136/87 (BP Location: Right Arm)   Pulse 81   Temp 98.1 F (36.7 C) (Oral)   Resp 18   Ht 5\' 2"  (1.575 m)   Wt 66.2 kg   LMP 02/10/2017 (Approximate)   BMI 26.70 kg/m   Gen: NAD CV: Regular rate Pulm: No increased work of breathing Pelvic: 1.5/40/-3 (RN  exam, unchanged > 1 hour later on exam by same RN). Ext: No signs of DVT  NST Baseline: 125 Variability: moderate Accelerations: present Decelerations: absent Tocometry: occasional contractions The patient was monitored for greater than 1 hour, fetal heart rate tracing was deemed reactive.  Consults: None  Significant Findings/ Diagnostic Studies: No labs indicated  Procedures: NST  Discharge Condition: good  Disposition: Discharge home  Diet: Regular diet  Discharge Activity: Activity as tolerated  Discharge Instructions    Discharge activity:  No Restrictions   Complete by:  As directed    Discharge diet:  No restrictions   Complete by:  As directed    Fetal Kick Count:  Lie on our left side for one hour after a meal, and count the number of times your baby kicks.  If it is less than 5  times, get up, move around and drink some juice.  Repeat the test 30 minutes later.  If it is still less than 5 kicks in an hour, notify your doctor.   Complete by:  As directed    LABOR:  When conractions begin, you should start to time them from the beginning of one contraction to the beginning  of the next.  When contractions are 5 - 10 minutes apart or less and have been regular for at least an hour, you should call your health care provider.   Complete by:  As directed    No sexual activity restrictions   Complete by:  As directed    Notify physician for bleeding from the vagina   Complete by:  As directed    Notify physician for blurring of vision or spots before the eyes   Complete by:  As directed    Notify physician for chills or fever   Complete by:  As directed    Notify physician for fainting spells, "black outs" or loss of consciousness   Complete by:  As directed    Notify physician for increase in vaginal discharge   Complete by:  As directed    Notify physician for leaking of fluid   Complete by:  As directed    Notify physician for pain or burning when urinating   Complete by:  As directed    Notify physician for pelvic pressure (sudden increase)   Complete by:  As directed    Notify physician for severe or continued nausea or vomiting   Complete by:  As directed    Notify physician for sudden gushing of fluid from the vagina (with or without continued leaking)   Complete by:  As directed    Notify physician for sudden, constant, or occasional abdominal pain   Complete by:  As directed    Notify physician if baby moving less than usual   Complete by:  As directed      Allergies as of 12/01/2017      Reactions   Penicillins Other (See Comments)   Other Reaction: Not Assessed   Sulfa Antibiotics Other (See Comments)   Other Reaction: Not Assessed   Wellbutrin [bupropion]       Medication List    STOP taking these medications   nitrofurantoin  (macrocrystal-monohydrate) 100 MG capsule Commonly known as:  MACROBID      Reactive NST, no cervical change or other signs of labor. Make ROB appointment.  Signed: Oswaldo Conroy, CNM  12/01/2017

## 2017-12-03 ENCOUNTER — Other Ambulatory Visit: Payer: Self-pay

## 2017-12-03 ENCOUNTER — Observation Stay
Admission: EM | Admit: 2017-12-03 | Discharge: 2017-12-04 | Disposition: A | Discharge status: Home / Self Care | Payer: Medicaid Other | Attending: Obstetrics and Gynecology | Admitting: Obstetrics and Gynecology

## 2017-12-03 ENCOUNTER — Telehealth: Payer: Self-pay

## 2017-12-03 DIAGNOSIS — O26853 Spotting complicating pregnancy, third trimester: Secondary | ICD-10-CM

## 2017-12-03 DIAGNOSIS — Z3A40 40 weeks gestation of pregnancy: Secondary | ICD-10-CM | POA: Insufficient documentation

## 2017-12-03 DIAGNOSIS — F17210 Nicotine dependence, cigarettes, uncomplicated: Secondary | ICD-10-CM | POA: Insufficient documentation

## 2017-12-03 DIAGNOSIS — O4693 Antepartum hemorrhage, unspecified, third trimester: Secondary | ICD-10-CM | POA: Diagnosis present

## 2017-12-03 DIAGNOSIS — Z888 Allergy status to other drugs, medicaments and biological substances status: Secondary | ICD-10-CM | POA: Diagnosis not present

## 2017-12-03 DIAGNOSIS — O99333 Smoking (tobacco) complicating pregnancy, third trimester: Secondary | ICD-10-CM | POA: Insufficient documentation

## 2017-12-03 DIAGNOSIS — Z880 Allergy status to penicillin: Secondary | ICD-10-CM | POA: Diagnosis not present

## 2017-12-03 DIAGNOSIS — Z882 Allergy status to sulfonamides status: Secondary | ICD-10-CM | POA: Insufficient documentation

## 2017-12-03 NOTE — Telephone Encounter (Signed)
If she is having decreased fetal movement than she should go over to the hospital for an NST.

## 2017-12-03 NOTE — Telephone Encounter (Signed)
Pt reports her due date was yesterday. She has been having mild contractions since Sunday. She went to the hospital Monday & they sent her home. She is still having ctx every 30-40 minutes. The baby has slowed down moving but her water hasn't broke. Pt inquiring if she needs to be checked for dilation, go to hospital or wait it out. ZO#[phone]

## 2017-12-03 NOTE — Discharge Summary (Signed)
Physician Final Progress Note  Patient ID: Cheryl Mccullough MRN: [id number] DOB/AGE: [birth year] 32 y.o.  Admit date: 12/03/2017 Admitting provider: Vena Austria, MD Discharge date: 12/03/2017   Admission Diagnoses: vaginal bleeding  Discharge Diagnoses:  Active Problems:   Indication for care in labor and delivery, antepartum Reactive NST, not in labor, no active bleeding  History of Present Illness: The patient is a 32 y.o. female G3P2002 at [redacted]w[redacted]d who presents for vaginal bleeding that occurred earlier this evening. She noticed spots when wiping. She admits intercourse this morning. She denies active bleeding. She denies leakage of fluid. She admits positive fetal movement. She admits contractions every 30 to 40 minutes. She reports feeling frustrated about having pain every 30 to 40 minutes and not being able to sleep. She has missed her recent prenatal appointments and has been seen in triage a couple of days ago. Discussed a plan of care with the patient to return in 5 days for planned induction of labor. Labor precautions reviewed. She is encouraged to pick up some tylenol PM or unisom on the way home so she can get some sleep tonight.   Past Medical History:  Diagnosis Date  . Ovarian cyst     Past Surgical History:  Procedure Laterality Date  . OVARIAN CYST REMOVAL Right     No current facility-administered medications on file prior to encounter.    No current outpatient medications on file prior to encounter.    Allergies  Allergen Reactions  . Penicillins Other (See Comments)    Other Reaction: Not Assessed  . Sulfa Antibiotics Other (See Comments)    Other Reaction: Not Assessed  . Wellbutrin [Bupropion]     Social History   Socioeconomic History  . Marital status: Single    Spouse name: Not on file  . Number of children: Not on file  . Years of education: Not on file  . Highest education level: Not on file  Occupational History  . Not on file  Social  Needs  . Financial resource strain: Not on file  . Food insecurity:    Worry: Not on file    Inability: Not on file  . Transportation needs:    Medical: Not on file    Non-medical: Not on file  Tobacco Use  . Smoking status: Current Some Day Smoker    Packs/day: 0.50    Types: Cigarettes  . Smokeless tobacco: Never Used  Substance and Sexual Activity  . Alcohol use: Never    Frequency: Never  . Drug use: Never  . Sexual activity: Yes    Birth control/protection: Pill  Lifestyle  . Physical activity:    Days per week: Not on file    Minutes per session: Not on file  . Stress: Not on file  Relationships  . Social connections:    Talks on phone: Not on file    Gets together: Not on file    Attends religious service: Not on file    Active member of club or organization: Not on file    Attends meetings of clubs or organizations: Not on file    Relationship status: Not on file  . Intimate partner violence:    Fear of current or ex partner: Not on file    Emotionally abused: Not on file    Physically abused: Not on file    Forced sexual activity: Not on file  Other Topics Concern  . Not on file  Social History Narrative  . Not on file  Physical Exam: BP (!) 125/92 (BP Location: Left Arm)   Pulse 78   Temp 98.2 F (36.8 C) (Oral)   Resp 18   Ht 5\' 2"  (1.575 m)   Wt 66.2 kg   LMP 02/10/2017 (Approximate)   BMI 26.70 kg/m   Gen: NAD CV: RRR Pulm: CTAB Pelvic: posterior and difficult to reach, 1/thick/-3, no blood on exam glove Toco: negative for contractions Fetal well being: 120 bpm baseline, moderate variability, +accelerations, -decelerations Ext: 2+ reflexes  Consults: None  Significant Findings/ Diagnostic Studies: none  Procedures: NST  Discharge Condition: good  Disposition: Discharge disposition: 01-Home or Self Care      Diet: Regular diet  Discharge Activity: Activity as tolerated  Discharge Instructions    Discharge activity:  No  Restrictions   Complete by:  As directed    Discharge diet:  No restrictions   Complete by:  As directed    Fetal Kick Count:  Lie on our left side for one hour after a meal, and count the number of times your baby kicks.  If it is less than 5 times, get up, move around and drink some juice.  Repeat the test 30 minutes later.  If it is still less than 5 kicks in an hour, notify your doctor.   Complete by:  As directed    LABOR:  When conractions begin, you should start to time them from the beginning of one contraction to the beginning  of the next.  When contractions are 5 - 10 minutes apart or less and have been regular for at least an hour, you should call your health care provider.   Complete by:  As directed    No sexual activity restrictions   Complete by:  As directed    Notify physician for bleeding from the vagina   Complete by:  As directed    Notify physician for blurring of vision or spots before the eyes   Complete by:  As directed    Notify physician for chills or fever   Complete by:  As directed    Notify physician for fainting spells, "black outs" or loss of consciousness   Complete by:  As directed    Notify physician for increase in vaginal discharge   Complete by:  As directed    Notify physician for leaking of fluid   Complete by:  As directed    Notify physician for pain or burning when urinating   Complete by:  As directed    Notify physician for pelvic pressure (sudden increase)   Complete by:  As directed    Notify physician for severe or continued nausea or vomiting   Complete by:  As directed    Notify physician for sudden gushing of fluid from the vagina (with or without continued leaking)   Complete by:  As directed    Notify physician for sudden, constant, or occasional abdominal pain   Complete by:  As directed    Notify physician if baby moving less than usual   Complete by:  As directed      Allergies as of 12/03/2017      Reactions   Penicillins  Other (See Comments)   Other Reaction: Not Assessed   Sulfa Antibiotics Other (See Comments)   Other Reaction: Not Assessed   Wellbutrin [bupropion]       Medication List    You have not been prescribed any medications.      Total time spent taking care  of this patient: 15 minutes  Signed: Tresea Mall, CNM  12/03/2017, 11:28 PM

## 2017-12-03 NOTE — Telephone Encounter (Signed)
Pt aware & will report to ER for check in to L&D

## 2017-12-08 ENCOUNTER — Inpatient Hospital Stay
Admission: EM | Admit: 2017-12-08 | Discharge: 2017-12-09 | DRG: 806 | Disposition: A | Discharge status: Home / Self Care | Payer: Medicaid Other | Attending: Maternal Newborn | Admitting: Maternal Newborn

## 2017-12-08 ENCOUNTER — Other Ambulatory Visit: Payer: Self-pay

## 2017-12-08 DIAGNOSIS — O9902 Anemia complicating childbirth: Secondary | ICD-10-CM | POA: Diagnosis present

## 2017-12-08 DIAGNOSIS — F17210 Nicotine dependence, cigarettes, uncomplicated: Secondary | ICD-10-CM

## 2017-12-08 DIAGNOSIS — Z3A40 40 weeks gestation of pregnancy: Secondary | ICD-10-CM

## 2017-12-08 DIAGNOSIS — O99324 Drug use complicating childbirth: Secondary | ICD-10-CM | POA: Diagnosis present

## 2017-12-08 DIAGNOSIS — F1290 Cannabis use, unspecified, uncomplicated: Secondary | ICD-10-CM | POA: Diagnosis present

## 2017-12-08 DIAGNOSIS — D649 Anemia, unspecified: Secondary | ICD-10-CM | POA: Diagnosis present

## 2017-12-08 DIAGNOSIS — O480 Post-term pregnancy: Principal | ICD-10-CM | POA: Diagnosis present

## 2017-12-08 DIAGNOSIS — O99334 Smoking (tobacco) complicating childbirth: Secondary | ICD-10-CM | POA: Diagnosis present

## 2017-12-08 LAB — TYPE AND SCREEN
ABO/RH(D): O POS
Antibody Screen: NEGATIVE

## 2017-12-08 LAB — URINE DRUG SCREEN, QUALITATIVE (ARMC ONLY)
AMPHETAMINES, UR SCREEN: NOT DETECTED
Barbiturates, Ur Screen: NOT DETECTED
Benzodiazepine, Ur Scrn: NOT DETECTED
COCAINE METABOLITE, UR ~~LOC~~: NOT DETECTED
Cannabinoid 50 Ng, Ur ~~LOC~~: POSITIVE — AB
MDMA (Ecstasy)Ur Screen: NOT DETECTED
Methadone Scn, Ur: NOT DETECTED
Opiate, Ur Screen: NOT DETECTED
PHENCYCLIDINE (PCP) UR S: NOT DETECTED
Tricyclic, Ur Screen: NOT DETECTED

## 2017-12-08 LAB — CBC
HCT: 29.9 % — ABNORMAL LOW (ref 35.0–47.0)
Hemoglobin: 10.2 g/dL — ABNORMAL LOW (ref 12.0–16.0)
MCH: 30.4 pg (ref 26.0–34.0)
MCHC: 34.3 g/dL (ref 32.0–36.0)
MCV: 88.8 fL (ref 80.0–100.0)
PLATELETS: 238 10*3/uL (ref 150–440)
RBC: 3.36 MIL/uL — ABNORMAL LOW (ref 3.80–5.20)
RDW: 14.3 % (ref 11.5–14.5)
WBC: 10.7 10*3/uL (ref 3.6–11.0)

## 2017-12-08 LAB — CHLAMYDIA/NGC RT PCR (ARMC ONLY)
Chlamydia Tr: NOT DETECTED
N gonorrhoeae: NOT DETECTED

## 2017-12-08 LAB — HEMOGLOBIN A1C
HEMOGLOBIN A1C: 5 % (ref 4.8–5.6)
Mean Plasma Glucose: 96.8 mg/dL

## 2017-12-08 MED ORDER — CALCIUM CARBONATE ANTACID 500 MG PO CHEW
400.0000 mg | CHEWABLE_TABLET | Freq: Three times a day (TID) | ORAL | Status: DC
  Administered 2017-12-08: 400 mg via ORAL
  Filled 2017-12-08: qty 2

## 2017-12-08 MED ORDER — IBUPROFEN 600 MG PO TABS
600.0000 mg | ORAL_TABLET | Freq: Four times a day (QID) | ORAL | Status: DC
  Administered 2017-12-08 – 2017-12-09 (×4): 600 mg via ORAL
  Filled 2017-12-08 (×4): qty 1

## 2017-12-08 MED ORDER — LIDOCAINE HCL (PF) 1 % IJ SOLN
INTRAMUSCULAR | Status: AC
  Filled 2017-12-08: qty 30

## 2017-12-08 MED ORDER — SIMETHICONE 80 MG PO CHEW: 80.0000 mg | CHEWABLE_TABLET | ORAL | Status: DC | PRN

## 2017-12-08 MED ORDER — ACETAMINOPHEN 325 MG PO TABS: 650.0000 mg | ORAL_TABLET | ORAL | Status: DC | PRN

## 2017-12-08 MED ORDER — AMMONIA AROMATIC IN INHA
RESPIRATORY_TRACT | Status: AC
  Filled 2017-12-08: qty 10

## 2017-12-08 MED ORDER — COCONUT OIL OIL: 1.0000 "application " | TOPICAL_OIL | Status: DC | PRN

## 2017-12-08 MED ORDER — LIDOCAINE HCL (PF) 1 % IJ SOLN: 30.0000 mL | INTRAMUSCULAR | Status: DC | PRN

## 2017-12-08 MED ORDER — OXYTOCIN BOLUS FROM INFUSION: 500.0000 mL | Freq: Once | INTRAVENOUS | Status: DC

## 2017-12-08 MED ORDER — ONDANSETRON HCL 4 MG/2ML IJ SOLN: 4.0000 mg | Freq: Four times a day (QID) | INTRAMUSCULAR | Status: DC | PRN

## 2017-12-08 MED ORDER — LACTATED RINGERS IV SOLN
INTRAVENOUS | Status: DC
  Administered 2017-12-08: 125 mL/h via INTRAVENOUS

## 2017-12-08 MED ORDER — ONDANSETRON HCL 4 MG PO TABS: 4.0000 mg | ORAL_TABLET | ORAL | Status: DC | PRN

## 2017-12-08 MED ORDER — OXYCODONE-ACETAMINOPHEN 5-325 MG PO TABS: 2.0000 | ORAL_TABLET | ORAL | Status: DC | PRN

## 2017-12-08 MED ORDER — BUTORPHANOL TARTRATE 1 MG/ML IJ SOLN
1.0000 mg | INTRAMUSCULAR | Status: DC | PRN
  Administered 2017-12-08 (×2): 1 mg via INTRAVENOUS
  Filled 2017-12-08 (×2): qty 1

## 2017-12-08 MED ORDER — TETANUS-DIPHTH-ACELL PERTUSSIS 5-2.5-18.5 LF-MCG/0.5 IM SUSP: 0.5000 mL | Freq: Once | INTRAMUSCULAR | Status: DC

## 2017-12-08 MED ORDER — OXYTOCIN 40 UNITS IN LACTATED RINGERS INFUSION - SIMPLE MED
1.0000 m[IU]/min | INTRAVENOUS | Status: DC
  Administered 2017-12-08: 2 m[IU]/min via INTRAVENOUS

## 2017-12-08 MED ORDER — SENNOSIDES-DOCUSATE SODIUM 8.6-50 MG PO TABS
2.0000 | ORAL_TABLET | ORAL | Status: DC
  Administered 2017-12-09: 2 via ORAL
  Filled 2017-12-08: qty 2

## 2017-12-08 MED ORDER — OXYTOCIN 10 UNIT/ML IJ SOLN
INTRAMUSCULAR | Status: AC
  Filled 2017-12-08: qty 2

## 2017-12-08 MED ORDER — DIPHENHYDRAMINE HCL 25 MG PO CAPS: 25.0000 mg | ORAL_CAPSULE | Freq: Four times a day (QID) | ORAL | Status: DC | PRN

## 2017-12-08 MED ORDER — TERBUTALINE SULFATE 1 MG/ML IJ SOLN: 0.2500 mg | Freq: Once | INTRAMUSCULAR | Status: DC | PRN

## 2017-12-08 MED ORDER — LACTATED RINGERS IV SOLN: 500.0000 mL | INTRAVENOUS | Status: DC | PRN

## 2017-12-08 MED ORDER — OXYTOCIN 40 UNITS IN LACTATED RINGERS INFUSION - SIMPLE MED
2.5000 [IU]/h | INTRAVENOUS | Status: DC
  Filled 2017-12-08 (×2): qty 1000

## 2017-12-08 MED ORDER — OXYCODONE-ACETAMINOPHEN 5-325 MG PO TABS: 1.0000 | ORAL_TABLET | ORAL | Status: DC | PRN

## 2017-12-08 MED ORDER — BENZOCAINE-MENTHOL 20-0.5 % EX AERO: 1.0000 "application " | INHALATION_SPRAY | CUTANEOUS | Status: DC | PRN

## 2017-12-08 MED ORDER — PRENATAL MULTIVITAMIN CH
1.0000 | ORAL_TABLET | Freq: Every day | ORAL | Status: DC
  Administered 2017-12-09: 1 via ORAL
  Filled 2017-12-08: qty 1

## 2017-12-08 MED ORDER — ONDANSETRON HCL 4 MG/2ML IJ SOLN: 4.0000 mg | INTRAMUSCULAR | Status: DC | PRN

## 2017-12-08 MED ORDER — WITCH HAZEL-GLYCERIN EX PADS: 1.0000 "application " | MEDICATED_PAD | CUTANEOUS | Status: DC | PRN

## 2017-12-08 MED ORDER — DIBUCAINE 1 % RE OINT: 1.0000 "application " | TOPICAL_OINTMENT | RECTAL | Status: DC | PRN

## 2017-12-08 MED ORDER — MISOPROSTOL 200 MCG PO TABS
ORAL_TABLET | ORAL | Status: AC
  Filled 2017-12-08: qty 4

## 2017-12-08 NOTE — Plan of Care (Signed)
  Problem: Education: Goal: Knowledge of General Education information will improve Description Including pain rating scale, medication(s)/side effects and non-pharmacologic comfort measures Outcome: Progressing   Problem: Clinical Measurements: Goal: Ability to maintain clinical measurements within normal limits will improve Outcome: Progressing Goal: Will remain free from infection Outcome: Progressing Goal: Diagnostic test results will improve Outcome: Progressing Goal: Respiratory complications will improve Outcome: Progressing Goal: Cardiovascular complication will be avoided Outcome: Progressing   Problem: Activity: Goal: Risk for activity intolerance will decrease Outcome: Progressing   Problem: Nutrition: Goal: Adequate nutrition will be maintained Outcome: Progressing   Problem: Coping: Goal: Level of anxiety will decrease Outcome: Progressing   Problem: Pain Managment: Goal: General experience of comfort will improve Outcome: Progressing   Problem: Safety: Goal: Ability to remain free from injury will improve Outcome: Progressing   Problem: Skin Integrity: Goal: Risk for impaired skin integrity will decrease Outcome: Progressing  Spontaneous SVD viable female infant over intact perineum followed by uncomplicated initial PP recovery in L&D.  Lochia WNL.  Vital signs stable.  No complaints.  Able to void.  Demonstrates appropriate bonding through holding infant.  Family members and father of baby at bedside and supportive.  Transferred to PP unit via wheelchair in stable condition.  Report to [place], [place]

## 2017-12-08 NOTE — Discharge Summary (Signed)
OB Discharge Summary     Patient Name: Cheryl Mccullough DOB: 1985-08-08 MRN: [id number]  Date of admission: 12/08/2017 Delivering Provider: Oswaldo Conroy, CNM  Date of Delivery: 12/08/2017  Date of discharge: 12/09/2017  Admitting diagnosis: Induction Intrauterine pregnancy: [redacted]w[redacted]d     Secondary diagnosis: None     Discharge diagnosis: Term Pregnancy Delivered,                  Hospital course:  Induction of Labor With Vaginal Delivery   32 y.o. yo [id number] at [redacted]w[redacted]d was admitted to the hospital 12/08/2017 for induction of labor.  Indication for induction: Postdates.  Patient had an uncomplicated labor course as follows: Membrane Rupture Time/Date: 1:32 PM ,12/08/2017   Intrapartum Procedures: Episiotomy: None [1]                                         Lacerations:  None [1]  Patient had delivery of a Viable infant.  Information for the patient's newborn:  Jeania, Nater [[id number]]  Delivery Method: Vaginal, Spontaneous(Filed from Delivery Summary)   12/08/2017  Details of delivery can be found in separate delivery note.  Patient had a routine postpartum course. Patient is discharged home 12/09/17.                                                                 Post partum procedures:none  Complications: None  Physical exam on 12/09/2017: Vitals:   12/08/17 2358 12/09/17 0442 12/09/17 0833 12/09/17 1636  BP: (!) 142/82 123/64 122/75 129/78  Pulse: (!) 49 (!) 56 (!) 58 72  Resp: 18 18 20 18   Temp: 98.3 F (36.8 C) 98.3 F (36.8 C) 98.3 F (36.8 C) 98 F (36.7 C)  TempSrc: Oral Oral Oral Oral  SpO2: 100% 99% 100%   Weight:      Height:        Labs: Lab Results  Component Value Date   WBC 11.1 (H) 12/09/2017   HGB 7.5 (L) 12/09/2017   HCT 22.5 (L) 12/09/2017   MCV 89.5 12/09/2017   PLT 205 12/09/2017   CMP Latest Ref Rng & Units 06/26/2011  Glucose 65 - 99 mg/dL 13(Y)  BUN 7 - 18 mg/dL 10  Creatinine 8.65 - 7.84 mg/dL 6.96  Sodium 295 - 284 mmol/L 137  Potassium  3.5 - 5.1 mmol/L 3.4(L)  Chloride 98 - 107 mmol/L 104  CO2 21 - 32 mmol/L 25  Calcium 8.5 - 10.1 mg/dL 9.0  Total Protein 6.4 - 8.2 g/dL 1.3(K)  Total Bilirubin 0.2 - 1.0 mg/dL 0.2  Alkaline Phos 50 - 136 Unit/L 78  AST 15 - 37 Unit/L 30  ALT U/L 19    Discharge instruction: per After Visit Summary.  Medications:  Allergies as of 12/09/2017      Reactions   Penicillins Other (See Comments)   Other Reaction: Not Assessed   Sulfa Antibiotics Other (See Comments)   Other Reaction: Not Assessed   Wellbutrin [bupropion]       Medication List    TAKE these medications   ibuprofen 600 MG tablet Commonly known as:  ADVIL,MOTRIN Take 1 tablet (600 mg total) by mouth  every 6 (six) hours.       Diet: routine diet  Activity: Advance as tolerated. Pelvic rest for 6 weeks.   Outpatient follow up: Follow-up Information    Oswaldo Conroy, CNM. Schedule an appointment as soon as possible for a visit in 6 week(s).   Specialty:  Certified Nurse Midwife Contact information: [address] [place] [address] [phone]             Postpartum contraception: Not Discussed Rhogam Given postpartum: no Rubella vaccine given postpartum: no Varicella vaccine given postpartum: no  Newborn Data: Live born female  Birth Weight:   APGAR: 8, 9  Newborn Delivery   Birth date/time:  12/08/2017 13:36:00 Delivery type:  Vaginal, Spontaneous      Baby Feeding: Bottle  Disposition:home with mother  SIGNED: Vena Austria, MD 12/09/2017 4:49 PM

## 2017-12-08 NOTE — H&P (Signed)
OB History & Physical   History of Present Illness:  Chief Complaint: presenting for induction of labor  HPI:  Cheryl Mccullough is a 32 y.o. G24P2002 female at [redacted]w[redacted]d dated by ultrasound.  Her pregnancy has been complicated by recurrent/persistant UTI, inadequate prenatal care in 3rd trimester.  28 week labs were never completed. Hgb A1C today.   She reports contractions every 30-40 minutes for the past 4 days.   She denies leakage of fluid.   She denies vaginal bleeding.   She reports fetal movement.    Maternal Medical History:   Past Medical History:  Diagnosis Date  . Ovarian cyst     Past Surgical History:  Procedure Laterality Date  . OVARIAN CYST REMOVAL Right     Allergies  Allergen Reactions  . Penicillins Other (See Comments)    Other Reaction: Not Assessed  . Sulfa Antibiotics Other (See Comments)    Other Reaction: Not Assessed  . Wellbutrin [Bupropion]     Prior to Admission medications   Not on File    OB History  Gravida Para Term Preterm AB Living  3 2 2     2   SAB TAB Ectopic Multiple Live Births          2    # Outcome Date GA Lbr Len/2nd Weight Sex Delivery Anes PTL Lv  3 Current           2 Term 05/05/12 [redacted]w[redacted]d  3204 g M Vag-Spont   LIV  1 Term 02/03/08 [redacted]w[redacted]d  3062 g Genella Mech   LIV    Prenatal care site: [facility]  Social History: She  reports that she has been smoking cigarettes. She has been smoking about 0.50 packs per day. She has never used smokeless tobacco. She reports that she does not drink alcohol or use drugs.  Family History: family history includes Breast cancer (age of onset: 64) in her mother.    Review of Systems: Negative x 10 systems reviewed except as noted in the HPI.    Physical Exam:  Vital Signs: BP 104/80 (BP Location: Left Arm)   Pulse 77   Temp 98.5 F (36.9 C) (Oral)   Resp 16   Ht 5\' 2"  (1.575 m)   Wt 70.8 kg   LMP 02/10/2017 (Approximate)   BMI 28.53 kg/m  Constitutional: Well nourished, well  developed female in no acute distress.  HEENT: normal Skin: Warm and dry.  Cardiovascular: Regular rate and rhythm.   Extremity: reflexes 2+, no evidence of DVT  Respiratory: Clear to auscultation bilateral. Normal respiratory effort Abdomen: FHT present Back: no CVAT Neuro: DTRs 2+, Cranial nerves grossly intact Psych: Alert and Oriented x3. No memory deficits. Normal mood and affect.  MS: normal gait, normal bilateral lower extremity ROM/strength/stability.  Pelvic exam:  is not limited by body habitus EGBUS: within normal limits Vagina: within normal limits and with normal mucosa  Cervix: posterior but able to reach and sweep cervix, 1.5/70/-1   Pertinent Results:  Prenatal Labs: Blood type/Rh O positive  Antibody screen negative  Rubella Immune  Varicella Immune    RPR Non-reactive  HBsAg negative  HIV negative  GC negative  Chlamydia negative  Genetic screening Not done  1 hour GTT Not done  3 hour GTT NA  GBS negative on 9/3 by PCR   Baseline FHR: 120 beats/min   Variability: moderate   Accelerations: present   Decelerations: absent Contractions: present frequency: every 3-6 Overall assessment: reassuring    Assessment:  Cheryl Mccullough is a 32 y.o. G60P2002 female at [redacted]w[redacted]d with elective induction. Postdates tomorrow.  Plan:  1. Admit to Labor & Delivery  2. CBC, T&S, Clrs, IVF 3. GBS negative.   4. Fetal well-being: Category I 5. Begin induction with pitocin  Tresea Mall, CNM 12/08/2017 6:50 AM

## 2017-12-09 LAB — CBC
HCT: 22.5 % — ABNORMAL LOW (ref 35.0–47.0)
HEMOGLOBIN: 7.5 g/dL — AB (ref 12.0–16.0)
MCH: 29.9 pg (ref 26.0–34.0)
MCHC: 33.4 g/dL (ref 32.0–36.0)
MCV: 89.5 fL (ref 80.0–100.0)
PLATELETS: 205 10*3/uL (ref 150–440)
RBC: 2.51 MIL/uL — ABNORMAL LOW (ref 3.80–5.20)
RDW: 13.9 % (ref 11.5–14.5)
WBC: 11.1 10*3/uL — ABNORMAL HIGH (ref 3.6–11.0)

## 2017-12-09 LAB — RPR: RPR Ser Ql: NONREACTIVE

## 2017-12-09 MED ORDER — FERROUS SULFATE 325 (65 FE) MG PO TABS
325.0000 mg | ORAL_TABLET | Freq: Two times a day (BID) | ORAL | Status: DC
  Administered 2017-12-09 (×2): 325 mg via ORAL
  Filled 2017-12-09 (×2): qty 1

## 2017-12-09 MED ORDER — VITAMIN C 500 MG PO TABS
500.0000 mg | ORAL_TABLET | Freq: Two times a day (BID) | ORAL | Status: DC
  Administered 2017-12-09: 500 mg via ORAL
  Filled 2017-12-09: qty 1

## 2017-12-09 MED ORDER — IBUPROFEN 600 MG PO TABS: 600.0000 mg | ORAL_TABLET | Freq: Four times a day (QID) | ORAL | 0 refills | Status: DC

## 2017-12-09 NOTE — Clinical Social Work Note (Signed)
The following is the CSW documentation placed on the patient's newborn's MEDICAL RECORD NUMBER CLINICAL SOCIAL WORK MATERNAL/CHILD NOTE  Patient Details  Name: Boy Jaidynn Balster MRN: [id number] Date of Birth: [birth year]/08/2015  Date:  12/09/2017  Clinical Social Worker Initiating Note:  York Spaniel MSW,LCSW         Date/Time: Initiated:  12/09/17/                 Child's Name:      Biological Parents:  Mother, Father   Need for Interpreter:  None   Reason for Referral:  Current Substance Use/Substance Use During Pregnancy    Address:  [address] [place] [address]    Phone number:  [phone] (home)     Additional phone number: none  Household Members/Support Persons (HM/SP):       HM/SP Name Relationship DOB or Age  HM/SP -1     HM/SP -2     HM/SP -3     HM/SP -4     HM/SP -5     HM/SP -6     HM/SP -7     HM/SP -8       Natural Supports (not living in the home): Friends, Immediate Family   Professional Supports:    Employment:    Type of Work:     Education:      Homebound arranged:    Financial Resources:Self-Pay    Other Resources:     Cultural/Religious Considerations Which May Impact Care: none  Strengths: Ability to meet basic needs , Home prepared for child    Psychotropic Medications:         Pediatrician:       Pediatrician List:   [place]   [place]   [facility]     Pediatrician Fax Number:    Risk Factors/Current Problems: Substance Use    Cognitive State: Alert    Mood/Affect: Calm    CSW Assessment:CSW consulted to complete assessment for drug positive newborn. CSW spoke with patient's mother and father of patient. Patient's mother reports that she lives in the home with her two other sons ages 43 and 51. Patient's mother reports she has all supplies and necessities for her and her children. Patient reports no  history of mental illness. CSW provided education regarding postpartum depression. CSW informed patient that her newborn's drug screen was positive for marijuana. CSW explained that a Dept of Social Services child protective report would be made and explained this process. Patient's mother verbalized understanding.   CSW made a DSS CPS report to [facility].  CSW Plan/Description: Child Protective Service Report     York Spaniel, LCSW 12/09/2017, 2:31 PM

## 2017-12-09 NOTE — Progress Notes (Signed)
Pt discharged with infant.  Discharge instructions, prescriptions and follow up appointment given to and reviewed with pt. Pt verbalized understanding. Escorted out by auxillary. 

## 2017-12-09 NOTE — Progress Notes (Signed)
Post Partum Day 1 Subjective: no complaints, up ad lib, voiding and tolerating PO. Denies feeling lightheaded when OOB. Bottle feeding. Unsure if baby will be discharged later today.  Objective: Blood pressure 122/75, pulse (!) 58, temperature 98.3 F (36.8 C), temperature source Oral, resp. rate 20, height 5\' 2"  (1.575 m), weight 70.8 kg, last menstrual period 02/10/2017, SpO2 100 %, unknown if currently breastfeeding.  Physical Exam:  General: alert, cooperative and no distress Lochia: appropriate Uterine Fundus: firm/ U-1/ML/NT  DVT Evaluation: No evidence of DVT seen on physical exam.  Recent Labs    12/08/17 0533 12/09/17 0614  HGB 10.2* 7.5*  HCT 29.9* 22.5*  WBC 10.7 11.1*  PLT 238 205    Assessment/Plan: PPD #1 stable Chronic anemia worsened with acute blood loss from delivery  Vitamins, iron and vitamin C  Safety precautions discussed to prevent falls O POS/ RI/ VI Bottle Last TDAP? Contraception? Possible discharge today if baby discharged.   LOS: 1 day   Farrel Conners 12/09/2017, 8:43 AM

## 2017-12-10 ENCOUNTER — Other Ambulatory Visit: Payer: Self-pay | Admitting: Advanced Practice Midwife

## 2017-12-10 DIAGNOSIS — N3000 Acute cystitis without hematuria: Secondary | ICD-10-CM

## 2017-12-10 LAB — URINE CULTURE: Culture: >=100000 — AB

## 2017-12-10 MED ORDER — CEPHALEXIN 500 MG PO CAPS: 500.0000 mg | ORAL_CAPSULE | Freq: Two times a day (BID) | ORAL | 0 refills | Status: DC

## 2017-12-10 NOTE — Progress Notes (Signed)
Rx Keflex sent to patient pharmacy to treat UTI. She discharged from the hospital yesterday. Attempted to call her and no answer/mailbox full. Will send letter for her to pick up Rx.

## 2018-03-04 NOTE — L&D Delivery Note (Signed)
Date of delivery: 02/10/2019 Estimated Date of Delivery: 02/18/19 Patient's last menstrual period was 05/14/2018. EGA: [redacted]w[redacted]d  Delivery Note At 10:04 PM a viable female was delivered via Vaginal, Spontaneous (Presentation: OA/LOA).   APGAR: 8, 9; weight:  6 pounds 8 ounces, 2940 g.   Placenta status: Spontaneous, Intact.  Cord: 3 vessels with the following complications: marginal insertion.  Cord pH: NA  In room with patient offering labor support while she was sitting on birth ball. She began to feel pressure and was found to be +2 with easily reduced anterior lip. Baby born 2 minutes later. Mom pushed to deliver a viable female infant.  The head followed by shoulders, which delivered without difficulty, and the rest of the body.  Nuchal cord and arm cord noted and easily reduced following delivery of body.  Baby to mom's chest.  Cord clamped and cut after 3 min delay.  No cord blood obtained.  Placenta delivered spontaneously, intact, with a 3-vessel cord.   All counts correct.  Hemostasis obtained with IV pitocin and fundal massage.    Anesthesia: None Episiotomy:  none Lacerations:  none Suture Repair: NA Est. Blood Loss (mL): 475  Mom to postpartum.  Baby to Couplet care / Skin to Skin.  Rod Can, CNM 02/10/2019, 10:50 PM

## 2018-11-16 ENCOUNTER — Encounter: Payer: Medicaid Other | Admitting: Maternal Newborn

## 2018-11-18 ENCOUNTER — Other Ambulatory Visit (HOSPITAL_COMMUNITY)
Admission: RE | Admit: 2018-11-18 | Discharge: 2018-11-18 | Disposition: A | Discharge status: Home / Self Care | Payer: Medicaid Other | Source: Ambulatory Visit | Attending: Obstetrics & Gynecology | Admitting: Obstetrics & Gynecology

## 2018-11-18 ENCOUNTER — Encounter: Payer: Self-pay | Admitting: Obstetrics & Gynecology

## 2018-11-18 ENCOUNTER — Ambulatory Visit (INDEPENDENT_AMBULATORY_CARE_PROVIDER_SITE_OTHER): Payer: Medicaid Other | Admitting: Obstetrics & Gynecology

## 2018-11-18 ENCOUNTER — Other Ambulatory Visit: Payer: Self-pay

## 2018-11-18 VITALS — BP 120/80 | Wt 152.0 lb

## 2018-11-18 DIAGNOSIS — Z3689 Encounter for other specified antenatal screening: Secondary | ICD-10-CM

## 2018-11-18 DIAGNOSIS — N926 Irregular menstruation, unspecified: Secondary | ICD-10-CM

## 2018-11-18 DIAGNOSIS — Z124 Encounter for screening for malignant neoplasm of cervix: Secondary | ICD-10-CM

## 2018-11-18 DIAGNOSIS — Z3A26 26 weeks gestation of pregnancy: Secondary | ICD-10-CM

## 2018-11-18 DIAGNOSIS — Z131 Encounter for screening for diabetes mellitus: Secondary | ICD-10-CM

## 2018-11-18 DIAGNOSIS — O0990 Supervision of high risk pregnancy, unspecified, unspecified trimester: Secondary | ICD-10-CM

## 2018-11-18 DIAGNOSIS — O0992 Supervision of high risk pregnancy, unspecified, second trimester: Secondary | ICD-10-CM

## 2018-11-18 LAB — OB RESULTS CONSOLE VARICELLA ZOSTER ANTIBODY, IGG: Varicella: IMMUNE

## 2018-11-18 NOTE — Progress Notes (Signed)
11/18/2018   Chief Complaint: Missed period  Transfer of Care Patient: no  History of Present Illness: Ms. Allison QuarryCobb is a 33 y.o. [id number] 6132w6d based on Patient's last menstrual period was 05/14/2018. with an Estimated Date of Delivery: 02/18/19, with the above CC.   Her periods were: regular  She was using no method when she conceived.     Delivered last Oct; also has 6 and 3311 year olds. She has Negative signs or symptoms of nausea/vomiting of pregnancy. She has Negative signs or symptoms of miscarriage or preterm labor She identifies Negative Zika risk factors for her and her partner On any different medications around the time she conceived/early pregnancy: No  History of varicella: Yes   ROS: A 12-point review of systems was performed and negative, except as stated in the above HPI.  OBGYN History: As per HPI. OB History  Gravida Para Term Preterm AB Living  4 3 3     3   SAB TAB Ectopic Multiple Live Births        0 3    # Outcome Date GA Lbr Len/2nd Weight Sex Delivery Anes PTL Lv  4 Current           3 Term 12/08/17 3957w6d 184:33 / 00:03 7 lb 10.8 oz (3.48 kg) M Vag-Spont None  LIV  2 Term 05/05/12 6866w0d  7 lb 1 oz (3.204 kg) M Vag-Spont   LIV  1 Term 02/03/08 9063w0d  6 lb 12 oz (3.062 kg) M Vag-Spont   LIV   Any issues with any prior pregnancies: no Any prior children are healthy, doing well, without any problems or issues: yes History of pap smears: Yes. Last pap smear 2019. Abnormal: no  History of STIs: no   Past Medical History: Past Medical History:  Diagnosis Date  . Ovarian cyst     Past Surgical History: Past Surgical History:  Procedure Laterality Date  . OVARIAN CYST REMOVAL Right     Family History:  Family History  Problem Relation Age of Onset  . Breast cancer Mother 7940   She denies any female cancers, bleeding or blood clotting disorders.  She denies any history of mental retardation, birth defects or genetic disorders in her or the FOB's history   Social History:  Social History   Socioeconomic History  . Marital status: Single    Spouse name: Not on file  . Number of children: Not on file  . Years of education: Not on file  . Highest education level: Not on file  Occupational History  . Not on file  Social Needs  . Financial resource strain: Not on file  . Food insecurity    Worry: Not on file    Inability: Not on file  . Transportation needs    Medical: Not on file    Non-medical: Not on file  Tobacco Use  . Smoking status: Current Some Day Smoker    Packs/day: 0.50    Types: Cigarettes  . Smokeless tobacco: Never Used  Substance and Sexual Activity  . Alcohol use: Never    Frequency: Never  . Drug use: Never  . Sexual activity: Yes    Birth control/protection: Pill  Lifestyle  . Physical activity    Days per week: Not on file    Minutes per session: Not on file  . Stress: Not on file  Relationships  . Social Musicianconnections    Talks on phone: Not on file    Gets together: Not on file  Attends religious service: Not on file    Active member of club or organization: Not on file    Attends meetings of clubs or organizations: Not on file    Relationship status: Not on file  . Intimate partner violence    Fear of current or ex partner: Not on file    Emotionally abused: Not on file    Physically abused: Not on file    Forced sexual activity: Not on file  Other Topics Concern  . Not on file  Social History Narrative  . Not on file   Any pets in the household: yes  Allergy: Allergies  Allergen Reactions  . Penicillins Other (See Comments)    Other Reaction: Not Assessed  . Sulfa Antibiotics Other (See Comments)    Other Reaction: Not Assessed  . Wellbutrin [Bupropion]     Current Outpatient Medications:  Current Outpatient Medications:  .  cephALEXin (KEFLEX) 500 MG capsule, Take 1 capsule (500 mg total) by mouth 2 (two) times daily. (Patient not taking: Reported on 11/18/2018), Disp: 14 capsule,  Rfl: 0 .  ibuprofen (ADVIL,MOTRIN) 600 MG tablet, Take 1 tablet (600 mg total) by mouth every 6 (six) hours. (Patient not taking: Reported on 11/18/2018), Disp: 30 tablet, Rfl: 0   Physical Exam:   BP 120/80   Wt 152 lb (68.9 kg)   LMP 05/14/2018   BMI 27.80 kg/m  Body mass index is 27.8 kg/m. Constitutional: Well nourished, well developed female in no acute distress.  Neck:  Supple, normal appearance, and no thyromegaly  Cardiovascular: S1, S2 normal, no murmur, rub or gallop, regular rate and rhythm Respiratory:  Clear to auscultation bilateral. Normal respiratory effort Abdomen: positive bowel sounds and no masses, hernias; diffusely non tender to palpation, non distended Breasts: breasts appear normal, no suspicious masses, no skin or nipple changes or axillary nodes. Neuro/Psych:  Normal mood and affect.  Skin:  Warm and dry.  Lymphatic:  No inguinal lymphadenopathy.   Pelvic exam: is not limited by body habitus EGBUS: within normal limits, Vagina: within normal limits and with no blood in the vault, Cervix: normal appearing cervix without discharge or lesions, closed/long/high, Uterus:  enlarged: 25 cm FH, and Adnexa:  normal adnexa  Assessment: Ms. Stay is a 33 y.o. K1S0109 [redacted]w[redacted]d based on Patient's last menstrual period was 05/14/2018. with an Estimated Date of Delivery: 02/18/19,  for prenatal care.  Plan:  1) Avoid alcoholic beverages. 2) Patient encouraged not to smoke.  3) Discontinue the use of all non-medicinal drugs and chemicals.  4) Take prenatal vitamins daily.  5) Seatbelt use advised 6) Nutrition, food safety (fish, cheese advisories, and high nitrite foods) and exercise discussed. 7) Hospital and practice style delivering at [facility] discussed  8) Patient is asked about travel to areas at risk for the [place] virus, and counseled to avoid travel and exposure to mosquitoes or sexual partners who may have themselves been exposed to the virus. Testing is discussed, and  will be ordered as appropriate.  9) Childbirth classes at [facility] advised 10) Genetic Screening, such as with 1st Trimester Screening, cell free fetal DNA, AFP testing, and Ultrasound, as well as with amniocentesis and CVS as appropriate, is discussed with patient. She plans to have not genetic testing this pregnancy.   11) [place] soon 12) Smoking cessation counseling and options discussed  Problem list reviewed and updated.  Barnett Applebaum, MD, [facility], [facility] 11/18/2018  4:03 PM

## 2018-11-18 NOTE — Patient Instructions (Signed)

## 2018-11-19 LAB — RPR+RH+ABO+RUB AB+AB SCR+CB...
Antibody Screen: NEGATIVE
HIV Screen 4th Generation wRfx: NONREACTIVE
Hematocrit: 28.7 % — ABNORMAL LOW (ref 34.0–46.6)
Hemoglobin: 9.9 g/dL — ABNORMAL LOW (ref 11.1–15.9)
Hepatitis B Surface Ag: NEGATIVE
MCH: 29.5 pg (ref 26.6–33.0)
MCHC: 34.5 g/dL (ref 31.5–35.7)
MCV: 85 fL (ref 79–97)
Platelets: 290 10*3/uL (ref 150–450)
RBC: 3.36 x10E6/uL — ABNORMAL LOW (ref 3.77–5.28)
RDW: 12.8 % (ref 11.7–15.4)
RPR Ser Ql: NONREACTIVE
Rh Factor: POSITIVE
Rubella Antibodies, IGG: 2.33 index (ref >0.99)
Varicella zoster IgG: 1391 index (ref >165)
WBC: 11.8 10*3/uL — ABNORMAL HIGH (ref 3.4–10.8)

## 2018-11-20 ENCOUNTER — Ambulatory Visit (INDEPENDENT_AMBULATORY_CARE_PROVIDER_SITE_OTHER): Payer: Medicaid Other | Admitting: Advanced Practice Midwife

## 2018-11-20 ENCOUNTER — Other Ambulatory Visit: Payer: Self-pay

## 2018-11-20 ENCOUNTER — Encounter: Payer: Self-pay | Admitting: Advanced Practice Midwife

## 2018-11-20 ENCOUNTER — Encounter: Payer: Medicaid Other | Admitting: Advanced Practice Midwife

## 2018-11-20 ENCOUNTER — Ambulatory Visit (INDEPENDENT_AMBULATORY_CARE_PROVIDER_SITE_OTHER): Payer: Medicaid Other

## 2018-11-20 DIAGNOSIS — N926 Irregular menstruation, unspecified: Secondary | ICD-10-CM

## 2018-11-20 DIAGNOSIS — Z363 Encounter for antenatal screening for malformations: Secondary | ICD-10-CM | POA: Diagnosis not present

## 2018-11-20 DIAGNOSIS — Z3A27 27 weeks gestation of pregnancy: Secondary | ICD-10-CM

## 2018-11-20 DIAGNOSIS — Z3689 Encounter for other specified antenatal screening: Secondary | ICD-10-CM

## 2018-11-20 LAB — URINE CULTURE

## 2018-11-20 NOTE — Progress Notes (Signed)
    Routine Prenatal Care Visit- Virtual Visit  Subjective   Virtual Visit via Telephone Note  The patient did not return for her scheduled visit this afternoon. I attempted to call her regarding this mornings ultrasound results. She did not answer phone.  She needs to be scheduled for 2 week follow up with 28 wk labs and rob.  Anatomy scan: complete, normal, female, placenta anterior, cephalic, 1#4DC with heart rate of 147. Marginal cord insertion is noted.    Return in about 2 weeks (around 12/04/2018) for 28 week labs and rob.   Rod Can, [facility] 11/20/2018, 4:43 PM

## 2018-11-23 ENCOUNTER — Other Ambulatory Visit: Payer: Self-pay | Admitting: Obstetrics & Gynecology

## 2018-11-23 DIAGNOSIS — N3000 Acute cystitis without hematuria: Secondary | ICD-10-CM

## 2018-11-23 LAB — CYTOLOGY - PAP
Adequacy: ABSENT
Diagnosis: NEGATIVE

## 2018-11-23 MED ORDER — NITROFURANTOIN MONOHYD MACRO 100 MG PO CAPS: 100.0000 mg | ORAL_CAPSULE | Freq: Two times a day (BID) | ORAL | 0 refills | Status: DC

## 2018-11-23 NOTE — Progress Notes (Signed)
Left message to advise pt of DR Kenton Kingfisher message and return call if any questions

## 2018-11-23 NOTE — Progress Notes (Signed)
Pt has UTI, have her pick up Rx Also, schedule for her for ROB w Gluc[facility] Lab appt next week

## 2019-02-10 ENCOUNTER — Other Ambulatory Visit: Payer: Self-pay

## 2019-02-10 ENCOUNTER — Inpatient Hospital Stay
Admission: EM | Admit: 2019-02-10 | Discharge: 2019-02-12 | DRG: 806 | Discharge status: Against Medical Advice | Payer: Medicaid Other | Attending: Advanced Practice Midwife | Admitting: Advanced Practice Midwife

## 2019-02-10 DIAGNOSIS — Z20828 Contact with and (suspected) exposure to other viral communicable diseases: Secondary | ICD-10-CM | POA: Diagnosis present

## 2019-02-10 DIAGNOSIS — O99284 Endocrine, nutritional and metabolic diseases complicating childbirth: Secondary | ICD-10-CM | POA: Diagnosis present

## 2019-02-10 DIAGNOSIS — F17210 Nicotine dependence, cigarettes, uncomplicated: Secondary | ICD-10-CM | POA: Diagnosis present

## 2019-02-10 DIAGNOSIS — O99334 Smoking (tobacco) complicating childbirth: Secondary | ICD-10-CM | POA: Diagnosis present

## 2019-02-10 DIAGNOSIS — D649 Anemia, unspecified: Secondary | ICD-10-CM | POA: Diagnosis present

## 2019-02-10 DIAGNOSIS — O43123 Velamentous insertion of umbilical cord, third trimester: Secondary | ICD-10-CM | POA: Diagnosis present

## 2019-02-10 DIAGNOSIS — Z3A38 38 weeks gestation of pregnancy: Secondary | ICD-10-CM

## 2019-02-10 DIAGNOSIS — O9902 Anemia complicating childbirth: Secondary | ICD-10-CM | POA: Diagnosis present

## 2019-02-10 DIAGNOSIS — E876 Hypokalemia: Secondary | ICD-10-CM | POA: Diagnosis present

## 2019-02-10 DIAGNOSIS — O99324 Drug use complicating childbirth: Secondary | ICD-10-CM | POA: Diagnosis present

## 2019-02-10 DIAGNOSIS — O26893 Other specified pregnancy related conditions, third trimester: Secondary | ICD-10-CM | POA: Diagnosis present

## 2019-02-10 DIAGNOSIS — F1290 Cannabis use, unspecified, uncomplicated: Secondary | ICD-10-CM | POA: Diagnosis present

## 2019-02-10 DIAGNOSIS — O4202 Full-term premature rupture of membranes, onset of labor within 24 hours of rupture: Secondary | ICD-10-CM | POA: Diagnosis not present

## 2019-02-10 HISTORY — DX: Other specified health status: Z78.9

## 2019-02-10 LAB — COMPREHENSIVE METABOLIC PANEL
ALT: 15 U/L (ref 0–44)
AST: 21 U/L (ref 15–41)
Albumin: 2.9 g/dL — ABNORMAL LOW (ref 3.5–5.0)
Alkaline Phosphatase: 147 U/L — ABNORMAL HIGH (ref 38–126)
Anion gap: 10 (ref 5–15)
BUN: <5 mg/dL — ABNORMAL LOW (ref 6–20)
CO2: 23 mmol/L (ref 22–32)
Calcium: 7.9 mg/dL — ABNORMAL LOW (ref 8.9–10.3)
Chloride: 105 mmol/L (ref 98–111)
Creatinine, Ser: 0.58 mg/dL (ref 0.44–1.00)
GFR calc Af Amer: >60 mL/min (ref >60)
GFR calc non Af Amer: >60 mL/min (ref >60)
Glucose, Bld: 82 mg/dL (ref 70–99)
Potassium: 2.1 mmol/L — CL (ref 3.5–5.1)
Sodium: 138 mmol/L (ref 135–145)
Total Bilirubin: 0.4 mg/dL (ref 0.3–1.2)
Total Protein: 6.1 g/dL — ABNORMAL LOW (ref 6.5–8.1)

## 2019-02-10 LAB — URINE DRUG SCREEN, QUALITATIVE (ARMC ONLY)
Amphetamines, Ur Screen: NOT DETECTED
Barbiturates, Ur Screen: NOT DETECTED
Benzodiazepine, Ur Scrn: NOT DETECTED
Cannabinoid 50 Ng, Ur ~~LOC~~: POSITIVE — AB
Cocaine Metabolite,Ur ~~LOC~~: NOT DETECTED
MDMA (Ecstasy)Ur Screen: NOT DETECTED
Methadone Scn, Ur: NOT DETECTED
Opiate, Ur Screen: NOT DETECTED
Phencyclidine (PCP) Ur S: NOT DETECTED
Tricyclic, Ur Screen: NOT DETECTED

## 2019-02-10 LAB — RUPTURE OF MEMBRANE (ROM)PLUS: Rom Plus: POSITIVE

## 2019-02-10 LAB — TYPE AND SCREEN
ABO/RH(D): O POS
Antibody Screen: NEGATIVE

## 2019-02-10 LAB — CBC
HCT: 28.1 % — ABNORMAL LOW (ref 36.0–46.0)
Hemoglobin: 9.2 g/dL — ABNORMAL LOW (ref 12.0–15.0)
MCH: 27.2 pg (ref 26.0–34.0)
MCHC: 32.7 g/dL (ref 30.0–36.0)
MCV: 83.1 fL (ref 80.0–100.0)
Platelets: 250 10*3/uL (ref 150–400)
RBC: 3.38 MIL/uL — ABNORMAL LOW (ref 3.87–5.11)
RDW: 14.3 % (ref 11.5–15.5)
WBC: 9.6 10*3/uL (ref 4.0–10.5)
nRBC: 0 % (ref 0.0–0.2)

## 2019-02-10 LAB — CHLAMYDIA/NGC RT PCR (ARMC ONLY)
Chlamydia Tr: NOT DETECTED
N gonorrhoeae: NOT DETECTED

## 2019-02-10 LAB — RAPID HIV SCREEN (HIV 1/2 AB+AG)
HIV 1/2 Antibodies: NONREACTIVE
HIV-1 P24 Antigen - HIV24: NONREACTIVE

## 2019-02-10 LAB — HEMOGLOBIN A1C
Hgb A1c MFr Bld: 5.2 % (ref 4.8–5.6)
Mean Plasma Glucose: 102.54 mg/dL

## 2019-02-10 LAB — RESPIRATORY PANEL BY RT PCR (FLU A&B, COVID)
Influenza A by PCR: NEGATIVE
Influenza B by PCR: NEGATIVE
SARS Coronavirus 2 by RT PCR: NEGATIVE

## 2019-02-10 LAB — OB RESULTS CONSOLE HIV ANTIBODY (ROUTINE TESTING): HIV: NONREACTIVE

## 2019-02-10 LAB — OB RESULTS CONSOLE GC/CHLAMYDIA
Chlamydia: NEGATIVE
Gonorrhea: NEGATIVE

## 2019-02-10 LAB — GROUP B STREP BY PCR: Group B strep by PCR: NEGATIVE

## 2019-02-10 LAB — OB RESULTS CONSOLE GBS: GBS: NEGATIVE

## 2019-02-10 MED ORDER — LACTATED RINGERS IV SOLN: 500.0000 mL | INTRAVENOUS | Status: DC | PRN

## 2019-02-10 MED ORDER — LIDOCAINE HCL (PF) 1 % IJ SOLN
30.0000 mL | INTRAMUSCULAR | Status: DC | PRN
  Filled 2019-02-10: qty 30

## 2019-02-10 MED ORDER — OXYTOCIN 40 UNITS IN NORMAL SALINE INFUSION - SIMPLE MED
2.5000 [IU]/h | INTRAVENOUS | Status: DC
  Filled 2019-02-10: qty 1000

## 2019-02-10 MED ORDER — MISOPROSTOL 200 MCG PO TABS
ORAL_TABLET | ORAL | Status: AC
  Filled 2019-02-10: qty 4

## 2019-02-10 MED ORDER — IBUPROFEN 600 MG PO TABS
600.0000 mg | ORAL_TABLET | Freq: Four times a day (QID) | ORAL | Status: DC
  Administered 2019-02-10 – 2019-02-12 (×5): 600 mg via ORAL
  Filled 2019-02-10 (×6): qty 1

## 2019-02-10 MED ORDER — POTASSIUM CHLORIDE 10 MEQ/100ML IV SOLN
10.0000 meq | INTRAVENOUS | Status: DC
  Administered 2019-02-10: 10 meq via INTRAVENOUS
  Filled 2019-02-10 (×4): qty 100

## 2019-02-10 MED ORDER — POTASSIUM CHLORIDE 10 MEQ/100ML IV SOLN
10.0000 meq | INTRAVENOUS | Status: AC
  Administered 2019-02-10 (×6): 10 meq via INTRAVENOUS
  Filled 2019-02-10 (×6): qty 100

## 2019-02-10 MED ORDER — TERBUTALINE SULFATE 1 MG/ML IJ SOLN: 0.2500 mg | Freq: Once | INTRAMUSCULAR | Status: DC | PRN

## 2019-02-10 MED ORDER — OXYTOCIN BOLUS FROM INFUSION: 500.0000 mL | Freq: Once | INTRAVENOUS | Status: DC

## 2019-02-10 MED ORDER — OXYTOCIN 10 UNIT/ML IJ SOLN
INTRAMUSCULAR | Status: AC
  Filled 2019-02-10: qty 2

## 2019-02-10 MED ORDER — BUTORPHANOL TARTRATE 1 MG/ML IJ SOLN
1.0000 mg | INTRAMUSCULAR | Status: DC | PRN
  Administered 2019-02-10: 20:00:00 1 mg via INTRAVENOUS
  Filled 2019-02-10: qty 1

## 2019-02-10 MED ORDER — LACTATED RINGERS IV SOLN
INTRAVENOUS | Status: DC
  Administered 2019-02-10: 16:00:00 via INTRAVENOUS

## 2019-02-10 MED ORDER — POTASSIUM CHLORIDE CRYS ER 20 MEQ PO TBCR
20.0000 meq | EXTENDED_RELEASE_TABLET | Freq: Three times a day (TID) | ORAL | Status: DC
  Administered 2019-02-11: 20 meq via ORAL
  Filled 2019-02-10: qty 2

## 2019-02-10 MED ORDER — ONDANSETRON HCL 4 MG/2ML IJ SOLN: 4.0000 mg | Freq: Four times a day (QID) | INTRAMUSCULAR | Status: DC | PRN

## 2019-02-10 MED ORDER — ACETAMINOPHEN 325 MG PO TABS: 650.0000 mg | ORAL_TABLET | ORAL | Status: DC | PRN

## 2019-02-10 MED ORDER — OXYTOCIN 40 UNITS IN NORMAL SALINE INFUSION - SIMPLE MED
1.0000 m[IU]/min | INTRAVENOUS | Status: DC
  Administered 2019-02-10: 18:00:00 2 m[IU]/min via INTRAVENOUS

## 2019-02-10 MED ORDER — AMMONIA AROMATIC IN INHA
RESPIRATORY_TRACT | Status: AC
  Filled 2019-02-10: qty 10

## 2019-02-10 NOTE — Discharge Summary (Signed)
OB Discharge Summary     Patient Name: Cheryl Mccullough DOB: Nov 18, 1985 MRN: [id number]  Date of admission: 02/10/2019 Delivering provider: Rod Can, CNM  Date of Delivery: 02/10/2019  Date of discharge: 02/12/2019  Admitting diagnosis: leaking fluid 39 wks preg Intrauterine pregnancy: [redacted]w[redacted]d     Secondary diagnosis: None     Discharge diagnosis: Term Pregnancy Delivered                                                                                                Post partum procedures: replenish with IV and PO potassium  Augmentation: Pitocin  Complications: None  Hospital course:  Onset of Labor With Vaginal Delivery      33 y.o. yo [id number] at [redacted]w[redacted]d was admitted in Latent Labor on 02/10/2019 following SROM. Patient had an uncomplicated labor course as follows:  Membrane Rupture Time/Date: 7:00 AM ,02/10/2019   Patient had delivery of viable female 10:04 PM, 02/10/2019 See delivery note for details.  Patient had a postpartum course complicated by critical low potassium. Level increased from 2.1 on admission to 2.7 prior to discharge. Patient refused additional IV potassium and recheck of lab prior to discharge. She did not wait for discharge orders prior to leaving the hospital. Reviewed recommendation for continued replenishing this morning and she reluctantly agreed to 3 10 MEQ IV doses. She then only accepted one dose prior to leaving. She was tolerating regular diet and her pain was controlled with PO medications. She was ambulating and voiding without difficulty. She denied feeling bad even prior to her admission and stated this morning that she feels fine. Patient was stable at the time she left the hospital on 02/12/19. She was encouraged to pick up prescription for PO potassium.    Physical exam  Vitals:   02/11/19 1500 02/11/19 1533 02/11/19 2325 02/12/19 0914  BP: 137/80  135/85 121/74  Pulse: (!) 44 (!) 52 (!) 50 (!) 49  Resp: 18  18 20   Temp: 98 F (36.7 C)  98.3 F  (36.8 C) 98.6 F (37 C)  TempSrc: Oral  Oral Oral  SpO2: 99%  100% 99%  Weight:      Height:       General: alert Lochia: appropriate Uterine Fundus: firm Incision: N/A DVT Evaluation: No evidence of DVT seen on physical exam.  Labs: Lab Results  Component Value Date   WBC 15.4 (H) 02/11/2019   HGB 9.1 (L) 02/11/2019   HCT 28.4 (L) 02/11/2019   MCV 85.5 02/11/2019   PLT 272 02/11/2019    Discharge instruction: per After Visit Summary.  Medications:  Allergies as of 02/12/2019      Reactions   Penicillins Other (See Comments)   Other Reaction: Not Assessed   Sulfa Antibiotics Other (See Comments)   Other Reaction: Not Assessed   Wellbutrin [bupropion]       Medication List    STOP taking these medications   nitrofurantoin (macrocrystal-monohydrate) 100 MG capsule Commonly known as: Macrobid     TAKE these medications   ibuprofen 600 MG tablet Commonly known as: ADVIL Take 1 tablet (600 mg total)  by mouth every 6 (six) hours.   potassium chloride SA 20 MEQ tablet Commonly known as: KLOR-CON Take 1 tablet (20 mEq total) by mouth 2 (two) times daily.       Diet: routine diet  Activity: Advance as tolerated. Pelvic rest for 6 weeks.   Outpatient follow up: Follow-up Information    [facility] or [facility] if she prefers.  Schedule an appointment as soon as possible for a visit in 6 week(s).   Specialty: Obstetrics and Gynecology Why: postpartum follow up Contact information: [address] [place] [address] [phone]            Postpartum contraception: Progesterone only pills/partner plans vasectomy Rhogam Given postpartum: NA Rubella vaccine given postpartum: Immune Varicella vaccine given postpartum: Immune TDaP given antepartum or postpartum: not given  Newborn Data: Live born female: Jolene Birth Weight: 6 pounds 8 ounces, 2940 APGAR: 8, 9  Newborn Delivery   Birth date/time: 02/10/2019  22:04:00 Delivery type: Vaginal, Spontaneous       Baby Feeding: formula  Disposition:home with mother  SIGNED:  Tresea Mall, CNM 02/12/2019 3:15 PM

## 2019-02-10 NOTE — H&P (Signed)
OB History & Physical   History of Present Illness:  Chief Complaint: my water broke  HPI:  Cheryl Mccullough is a 33 y.o. G1P3003 female at [redacted]w[redacted]d dated by LMP.  Her pregnancy has been complicated by late and insufficient prenatal care, short interval between pregnancies, marijuana use.    She reports occasional contractions.   She reports leakage of fluid beginning at 7:00 AM this morning.   She denies vaginal bleeding.   She reports fetal movement.    Total weight gain for pregnancy: 10.4 kg   Obstetrical Problem List: pregnancy4 Problems (from 05/14/18 to present)    No problems associated with this episode.       Maternal Medical History:   Past Medical History:  Diagnosis Date  . Medical history non-contributory   . Ovarian cyst     Past Surgical History:  Procedure Laterality Date  . OVARIAN CYST REMOVAL Right     Allergies  Allergen Reactions  . Penicillins Other (See Comments)    Other Reaction: Not Assessed  . Sulfa Antibiotics Other (See Comments)    Other Reaction: Not Assessed  . Wellbutrin [Bupropion]     Prior to Admission medications   Medication Sig Start Date End Date Taking? Authorizing Provider  ibuprofen (ADVIL,MOTRIN) 600 MG tablet Take 1 tablet (600 mg total) by mouth every 6 (six) hours. Patient not taking: Reported on 11/18/2018 12/09/17   Malachy Mood, MD  nitrofurantoin, macrocrystal-monohydrate, (MACROBID) 100 MG capsule Take 1 capsule (100 mg total) by mouth 2 (two) times daily. Patient not taking: Reported on 02/10/2019 11/23/18   Gae Dry, MD    OB History  Gravida Para Term Preterm AB Living  4 3 3     3   SAB TAB Ectopic Multiple Live Births        0 3    # Outcome Date GA Lbr Len/2nd Weight Sex Delivery Anes PTL Lv  4 Current           3 Term 12/08/17 [redacted]w[redacted]d 184:33 / 00:03 3480 g M Vag-Spont None  LIV  2 Term 05/05/12 101w0d  3204 g M Vag-Spont   LIV  1 Term 02/03/08 [redacted]w[redacted]d  [address] g [address]   LIV    Prenatal care site:  [facility]  Social History: She  reports that she has been smoking cigarettes. She has been smoking about 0.50 packs per day. She has never used smokeless tobacco. She reports that she does not drink alcohol or use drugs.  Family History: family history includes Breast cancer (age of onset: 60) in her mother.    Review of Systems:  Review of Systems  Constitutional: Negative.   HENT: Negative.   Eyes: Negative.   Respiratory: Negative.   Cardiovascular: Negative.   Gastrointestinal: Negative.   Genitourinary: Negative.   Musculoskeletal: Negative.   Skin: Negative.   Neurological: Negative.   Endo/Heme/Allergies: Negative.   Psychiatric/Behavioral: Negative.      Physical Exam:  BP (!) 101/58 (BP Location: Left Arm)   Pulse 68   Temp 98.2 F (36.8 C) (Oral)   Resp 15   Ht 5\' 2"  (1.575 m)   Wt 67.1 kg   LMP 05/14/2018   BMI 27.07 kg/m   Constitutional: Well nourished, well developed female in no acute distress.  HEENT: normal Skin: Warm and dry.  Cardiovascular: Regular rate and rhythm.   Extremity: no edema  Respiratory: Clear to auscultation bilateral. Normal respiratory effort Abdomen: FHT present Back: no CVAT Neuro: DTRs 2+,  Cranial nerves grossly intact Psych: Alert and Oriented x3. No memory deficits. Normal mood and affect.  MS: normal gait, normal bilateral lower extremity ROM/strength/stability.  Pelvic exam: by RN Carlyon Shadow 4/60/-2 ROM Plus positive    Pertinent Results:  Prenatal Labs Blood type/Rh O positive  Antibody screen negative  Rubella Immune  Varicella Immune    RPR Non-reactive  HBsAg negative  HIV negative  GC negative  Chlamydia negative  Genetic screening Not done  1 hour GTT Hgb A1C pending  3 hour GTT NA  GBS Negative by PCR on 12/9   Baseline FHR: 135 beats/min   Variability: moderate   Accelerations: present   Decelerations: absent Contractions: present frequency: every 2-4 Overall assessment: reassuring   Lab  Results  Component Value Date   SARSCOV2NAA NEGATIVE 02/10/2019  ]  Assessment:  Cheryl Mccullough is a 33 y.o. G62P3003 female at [redacted]w[redacted]d with spontaneous rupture of membranes.   Plan:  1. Admit to Labor & Delivery  2. CBC, T&S, Clrs, IVF, RPR, Hgb A1C, Rapid HIV, ROM Plus, GC/CT, GBS by PCR, UDS 3. GBS negative.   4. Fetal well-being: Category I 5. Augment with pitocin   Tresea Mall, CNM 02/10/2019 6:52 PM

## 2019-02-10 NOTE — OB Triage Note (Signed)
Pt is a G4P3 at [redacted]w[redacted]d that presents from then ED with c/o ROM. Pt states she had 3 large gushes of clear fluid at 0700 todayand the some leaking in between. Pt denies VB, and states positive FM. Monitors applied and initial FHT 135. Pt appears to be grossly ruptured with a Fern collected.

## 2019-02-11 ENCOUNTER — Encounter: Payer: Self-pay | Admitting: *Deleted

## 2019-02-11 LAB — MAGNESIUM: Magnesium: 1.8 mg/dL (ref 1.7–2.4)

## 2019-02-11 LAB — COMPREHENSIVE METABOLIC PANEL
ALT: 13 U/L (ref 0–44)
AST: 22 U/L (ref 15–41)
Albumin: 2.7 g/dL — ABNORMAL LOW (ref 3.5–5.0)
Alkaline Phosphatase: 136 U/L — ABNORMAL HIGH (ref 38–126)
Anion gap: 9 (ref 5–15)
BUN: <5 mg/dL — ABNORMAL LOW (ref 6–20)
CO2: 22 mmol/L (ref 22–32)
Calcium: 8.3 mg/dL — ABNORMAL LOW (ref 8.9–10.3)
Chloride: 109 mmol/L (ref 98–111)
Creatinine, Ser: 0.54 mg/dL (ref 0.44–1.00)
GFR calc Af Amer: >60 mL/min (ref >60)
GFR calc non Af Amer: >60 mL/min (ref >60)
Glucose, Bld: 84 mg/dL (ref 70–99)
Potassium: 2.5 mmol/L — CL (ref 3.5–5.1)
Sodium: 140 mmol/L (ref 135–145)
Total Bilirubin: 0.3 mg/dL (ref 0.3–1.2)
Total Protein: 5.9 g/dL — ABNORMAL LOW (ref 6.5–8.1)

## 2019-02-11 LAB — CBC
HCT: 28.4 % — ABNORMAL LOW (ref 36.0–46.0)
Hemoglobin: 9.1 g/dL — ABNORMAL LOW (ref 12.0–15.0)
MCH: 27.4 pg (ref 26.0–34.0)
MCHC: 32 g/dL (ref 30.0–36.0)
MCV: 85.5 fL (ref 80.0–100.0)
Platelets: 272 10*3/uL (ref 150–400)
RBC: 3.32 MIL/uL — ABNORMAL LOW (ref 3.87–5.11)
RDW: 14.4 % (ref 11.5–15.5)
WBC: 15.4 10*3/uL — ABNORMAL HIGH (ref 4.0–10.5)
nRBC: 0 % (ref 0.0–0.2)

## 2019-02-11 LAB — RPR: RPR Ser Ql: NONREACTIVE

## 2019-02-11 MED ORDER — SENNOSIDES-DOCUSATE SODIUM 8.6-50 MG PO TABS
2.0000 | ORAL_TABLET | ORAL | Status: DC
  Administered 2019-02-11 – 2019-02-12 (×2): 2 via ORAL
  Filled 2019-02-11 (×2): qty 2

## 2019-02-11 MED ORDER — TETANUS-DIPHTH-ACELL PERTUSSIS 5-2.5-18.5 LF-MCG/0.5 IM SUSP: 0.5000 mL | Freq: Once | INTRAMUSCULAR | Status: DC

## 2019-02-11 MED ORDER — DIPHENHYDRAMINE HCL 25 MG PO CAPS: 25.0000 mg | ORAL_CAPSULE | Freq: Four times a day (QID) | ORAL | Status: DC | PRN

## 2019-02-11 MED ORDER — ACETAMINOPHEN 325 MG PO TABS
650.0000 mg | ORAL_TABLET | ORAL | Status: DC | PRN
  Administered 2019-02-11 – 2019-02-12 (×2): 650 mg via ORAL
  Filled 2019-02-11 (×2): qty 2

## 2019-02-11 MED ORDER — DIBUCAINE (PERIANAL) 1 % EX OINT: 1.0000 "application " | TOPICAL_OINTMENT | CUTANEOUS | Status: DC | PRN

## 2019-02-11 MED ORDER — SIMETHICONE 80 MG PO CHEW: 80.0000 mg | CHEWABLE_TABLET | ORAL | Status: DC | PRN

## 2019-02-11 MED ORDER — PRENATAL MULTIVITAMIN CH
1.0000 | ORAL_TABLET | Freq: Every day | ORAL | Status: DC
  Administered 2019-02-11: 12:00:00 1 via ORAL
  Filled 2019-02-11: qty 1

## 2019-02-11 MED ORDER — BENZOCAINE-MENTHOL 20-0.5 % EX AERO: 1.0000 "application " | INHALATION_SPRAY | CUTANEOUS | Status: DC | PRN

## 2019-02-11 MED ORDER — ONDANSETRON HCL 4 MG PO TABS: 4.0000 mg | ORAL_TABLET | ORAL | Status: DC | PRN

## 2019-02-11 MED ORDER — LACTATED RINGERS IV SOLN: INTRAVENOUS | Status: DC

## 2019-02-11 MED ORDER — WITCH HAZEL-GLYCERIN EX PADS: 1.0000 "application " | MEDICATED_PAD | CUTANEOUS | Status: DC | PRN

## 2019-02-11 MED ORDER — COCONUT OIL OIL: 1.0000 "application " | TOPICAL_OIL | Status: DC | PRN

## 2019-02-11 MED ORDER — ONDANSETRON HCL 4 MG/2ML IJ SOLN: 4.0000 mg | INTRAMUSCULAR | Status: DC | PRN

## 2019-02-11 MED ORDER — FERROUS SULFATE 325 (65 FE) MG PO TABS
325.0000 mg | ORAL_TABLET | Freq: Every day | ORAL | Status: DC
  Administered 2019-02-11 – 2019-02-12 (×2): 325 mg via ORAL
  Filled 2019-02-11 (×2): qty 1

## 2019-02-11 MED ORDER — POTASSIUM CHLORIDE CRYS ER 10 MEQ PO TBCR
30.0000 meq | EXTENDED_RELEASE_TABLET | Freq: Three times a day (TID) | ORAL | Status: DC
  Administered 2019-02-11 (×3): 30 meq via ORAL
  Filled 2019-02-11 (×6): qty 3

## 2019-02-11 NOTE — Progress Notes (Signed)
Post Partum Day 1 Subjective: up ad lib, voiding and tolerating PO. Bottle feeding.   Objective: Blood pressure 127/87, pulse 80, temperature 98 F (36.7 C), temperature source Oral, resp. rate 20, height 5\' 2"  (1.575 m), weight 67.1 kg, last menstrual period 05/14/2018, SpO2 100 %, unknown if currently breastfeeding.  Physical Exam:  General: alert, cooperative and no distress Lochia: appropriate Uterine Fundus: firm/ U-2/ML/NT  DVT Evaluation: No evidence of DVT seen on physical exam.  Recent Labs    02/10/19 1419 02/11/19 0558  HGB 9.2* 9.1*  HCT 28.1* 28.4*  WBC 9.6 15.4*  PLT 250 272   Results for orders placed or performed during the hospital encounter of 02/10/19 (from the past 24 hour(s))  Group B strep by PCR     Status: None   Collection Time: 02/10/19  2:00 PM   Specimen: Vaginal/Rectal; Genital  Result Value Ref Range   Group B strep by PCR NEGATIVE NEGATIVE  Urine Drug Screen, Qualitative ([facility] only)     Status: Abnormal   Collection Time: 02/10/19  2:00 PM  Result Value Ref Range   Tricyclic, Ur Screen NONE DETECTED NONE DETECTED   Amphetamines, Ur Screen NONE DETECTED NONE DETECTED   MDMA (Ecstasy)Ur Screen NONE DETECTED NONE DETECTED   Cocaine Metabolite,Ur [place] NONE DETECTED NONE DETECTED   Opiate, Ur Screen NONE DETECTED NONE DETECTED   Phencyclidine (PCP) Ur S NONE DETECTED NONE DETECTED   Cannabinoid 50 Ng, Ur [place] POSITIVE (A) NONE DETECTED   Barbiturates, Ur Screen NONE DETECTED NONE DETECTED   Benzodiazepine, Ur Scrn NONE DETECTED NONE DETECTED   Methadone Scn, Ur NONE DETECTED NONE DETECTED  Chlamydia/NGC rt PCR ([facility] only)     Status: None   Collection Time: 02/10/19  2:00 PM   Specimen: Vaginal/Rectal  Result Value Ref Range   Specimen source GC/Chlam ENDOCERVICAL    Chlamydia Tr NOT DETECTED NOT DETECTED   N gonorrhoeae NOT DETECTED NOT DETECTED  ROM Plus ([facility] only)     Status: None   Collection Time: 02/10/19  2:00 PM  Result Value Ref  Range   Rom Plus POSITIVE   Type and screen [facility]     Status: None   Collection Time: 02/10/19  2:14 PM  Result Value Ref Range   ABO/RH(D) O POS    Antibody Screen NEG    Sample Expiration      02/13/2019,2359 Performed at [facility], [address] [place], [place]   CBC     Status: Abnormal   Collection Time: 02/10/19  2:19 PM  Result Value Ref Range   WBC 9.6 4.0 - 10.5 K/uL   RBC 3.38 (L) 3.87 - 5.11 MIL/uL   Hemoglobin 9.2 (L) 12.0 - 15.0 g/dL   HCT 14/09/20 (L) 97.3 - 53.2 %   MCV 83.1 80.0 - 100.0 fL   MCH 27.2 26.0 - 34.0 pg   MCHC 32.7 30.0 - 36.0 g/dL   RDW 99.2 42.6 - 83.4 %   Platelets 250 150 - 400 K/uL   nRBC 0.0 0.0 - 0.2 %  RPR     Status: None   Collection Time: 02/10/19  2:19 PM  Result Value Ref Range   RPR Ser Ql NON REACTIVE NON REACTIVE  Comprehensive metabolic panel     Status: Abnormal   Collection Time: 02/10/19  2:19 PM  Result Value Ref Range   Sodium 138 135 - 145 mmol/L   Potassium 2.1 (LL) 3.5 - 5.1  mmol/L   Chloride 105 98 - 111 mmol/L   CO2 23 22 - 32 mmol/L   Glucose, Bld 82 70 - 99 mg/dL   BUN <5 (L) 6 - 20 mg/dL   Creatinine, Ser 5.780.58 0.44 - 1.00 mg/dL   Calcium 7.9 (L) 8.9 - 10.3 mg/dL   Total Protein 6.1 (L) 6.5 - 8.1 g/dL   Albumin 2.9 (L) 3.5 - 5.0 g/dL   AST 21 15 - 41 U/L   ALT 15 0 - 44 U/L   Alkaline Phosphatase 147 (H) 38 - 126 U/L   Total Bilirubin 0.4 0.3 - 1.2 mg/dL   GFR calc non Af Amer >60 >60 mL/min   GFR calc Af Amer >60 >60 mL/min   Anion gap 10 5 - 15  Hemoglobin A1c     Status: None   Collection Time: 02/10/19  2:19 PM  Result Value Ref Range   Hgb A1c MFr Bld 5.2 4.8 - 5.6 %   Mean Plasma Glucose 102.54 mg/dL  Rapid HIV screen [facility] L&D dept ONLY)     Status: None   Collection Time: 02/10/19  2:19 PM  Result Value Ref Range   HIV-1 P24 Antigen - HIV24 NON REACTIVE NON REACTIVE   HIV 1/2 Antibodies NON REACTIVE NON REACTIVE   Interpretation (HIV Ag Ab)       A non reactive test result means that HIV 1 or HIV 2 antibodies and HIV 1 p24 antigen were not detected in the specimen.  Respiratory Panel by RT PCR (Flu A&B, Covid) - Nasopharyngeal Swab     Status: None   Collection Time: 02/10/19  3:44 PM   Specimen: Nasopharyngeal Swab  Result Value Ref Range   SARS Coronavirus 2 by RT PCR NEGATIVE NEGATIVE   Influenza A by PCR NEGATIVE NEGATIVE   Influenza B by PCR NEGATIVE NEGATIVE  CBC     Status: Abnormal   Collection Time: 02/11/19  5:58 AM  Result Value Ref Range   WBC 15.4 (H) 4.0 - 10.5 K/uL   RBC 3.32 (L) 3.87 - 5.11 MIL/uL   Hemoglobin 9.1 (L) 12.0 - 15.0 g/dL   HCT 46.928.4 (L) 62.936.0 - 52.846.0 %   MCV 85.5 80.0 - 100.0 fL   MCH 27.4 26.0 - 34.0 pg   MCHC 32.0 30.0 - 36.0 g/dL   RDW 41.314.4 24.411.5 - 01.015.5 %   Platelets 272 150 - 400 K/uL   nRBC 0.0 0.0 - 0.2 %  Comprehensive metabolic panel     Status: Abnormal   Collection Time: 02/11/19  5:58 AM  Result Value Ref Range   Sodium 140 135 - 145 mmol/L   Potassium 2.5 (LL) 3.5 - 5.1 mmol/L   Chloride 109 98 - 111 mmol/L   CO2 22 22 - 32 mmol/L   Glucose, Bld 84 70 - 99 mg/dL   BUN <5 (L) 6 - 20 mg/dL   Creatinine, Ser 2.720.54 0.44 - 1.00 mg/dL   Calcium 8.3 (L) 8.9 - 10.3 mg/dL   Total Protein 5.9 (L) 6.5 - 8.1 g/dL   Albumin 2.7 (L) 3.5 - 5.0 g/dL   AST 22 15 - 41 U/L   ALT 13 0 - 44 U/L   Alkaline Phosphatase 136 (H) 38 - 126 U/L   Total Bilirubin 0.3 0.3 - 1.2 mg/dL   GFR calc non Af Amer >60 >60 mL/min   GFR calc Af Amer >60 >60 mL/min   Anion gap 9 5 - 15  Magnesium  Status: None   Collection Time: 02/11/19  5:58 AM  Result Value Ref Range   Magnesium 1.8 1.7 - 2.4 mg/dL   Assessment/Plan: PPD #1: stable-continue postpartum care Chronic anemia: vitamins and iron Hypokalemia: potassium has increased from 2.1 to 2.5 with IV replacement yesterday. Now on KCL orally 30 meq tid. Repeat BMP in AM. Unsure as to etiology of hypokalemia-Denies nausea and vomiting prior to delivery.  Tolerating a regular diet. Aware of foods high in potassium  O POS/ RI/ VI Bottle Contraception?pills Possible discharge tomorrow.   LOS: 1 day   Dalia Heading 02/11/2019, 11:01 AM

## 2019-02-11 NOTE — Discharge Instructions (Signed)
Discharge Instructions:   Follow-up Appointment: Schedule a postpartum appointment ASAP for a follow-up in 6 weeks!   If there are any new medications, they have been ordered and will be available for pickup at the listed pharmacy on your way home from the hospital.   Call office if you have any of the following: headache, visual changes, fever >101.0 F, chills, shortness of breath, breast concerns, excessive vaginal bleeding, incision drainage or problems, leg pain or redness, depression or any other concerns. If you have vaginal discharge with an odor, let your doctor know.   It is normal to bleed for up to 6 weeks. You should not soak through more than 1 pad in 1 hour. If you have a blood clot larger than your fist with continued bleeding, call your doctor.   Activity: Do not lift > 10 lbs for 6 weeks (do not lift anything heavier than your baby). No intercourse, tampons, swimming pools, hot tubs, baths (only showers) for 6 weeks.  No driving for 1-2 weeks. Continue prenatal vitamin, especially if breastfeeding. Increase calories and fluids (water) while breastfeeding.   Your milk will come in, in the next couple of days (right now it is colostrum). You may have a slight fever when your milk comes in, but it should go away on its own.  If it does not, and rises above 101 F please call the doctor. You will also feel achy and your breasts will be firm. They will also start to leak. If you are breastfeeding, continue as you have been and you can pump/express milk for comfort.   If you have too much milk, your breasts can become engorged, which could lead to mastitis. This is an infection of the milk ducts. It can be very painful and you will need to notify your doctor to obtain a prescription for antibiotics. You can also treat it with a shower or hot/cold compress.   For concerns about your baby, please call your pediatrician.  For breastfeeding concerns, the lactation consultant can be reached  at [phone].   Postpartum blues (feelings of happy one minute and sad another minute) are normal for the first few weeks but if it gets worse let your doctor know.   Congratulations! We enjoyed caring for you and your new bundle of joy!

## 2019-02-12 DIAGNOSIS — E876 Hypokalemia: Secondary | ICD-10-CM

## 2019-02-12 LAB — BASIC METABOLIC PANEL
Anion gap: 8 (ref 5–15)
BUN: 7 mg/dL (ref 6–20)
CO2: 23 mmol/L (ref 22–32)
Calcium: 8.9 mg/dL (ref 8.9–10.3)
Chloride: 110 mmol/L (ref 98–111)
Creatinine, Ser: 0.96 mg/dL (ref 0.44–1.00)
GFR calc Af Amer: >60 mL/min (ref >60)
GFR calc non Af Amer: >60 mL/min (ref >60)
Glucose, Bld: 75 mg/dL (ref 70–99)
Potassium: 2.7 mmol/L — CL (ref 3.5–5.1)
Sodium: 141 mmol/L (ref 135–145)

## 2019-02-12 MED ORDER — POTASSIUM CHLORIDE 10 MEQ/100ML IV SOLN
10.0000 meq | INTRAVENOUS | Status: AC
  Administered 2019-02-12: 10:00:00 10 meq via INTRAVENOUS
  Filled 2019-02-12 (×3): qty 100

## 2019-02-12 MED ORDER — LACTATED RINGERS IV SOLN
INTRAVENOUS | Status: DC
  Administered 2019-02-12: 10:00:00 via INTRAVENOUS

## 2019-02-12 MED ORDER — POTASSIUM CHLORIDE CRYS ER 20 MEQ PO TBCR: 20.0000 meq | EXTENDED_RELEASE_TABLET | Freq: Two times a day (BID) | ORAL | 0 refills | Status: DC

## 2019-02-12 NOTE — Progress Notes (Signed)
Patient reluctantly agrees to stay for IV potassium this morning. She is anxious to discharge to home. We discussed the importance of continuing PO potassium at home.   Results for Cheryl Mccullough, Cheryl Mccullough (MRN [id number]) as of 02/12/2019 10:09  Ref. Range 02/10/2019 14:19 02/10/2019 15:44 02/11/2019 05:58 02/12/2019 04:48  Potassium Latest Ref Range: 3.5 - 5.1 mmol/L 2.1 (LL)  2.5 (LL) 2.7 (LL)   Rod Can, CNM

## 2019-02-12 NOTE — Progress Notes (Signed)
Notified Manuela Schwartz SW about seeing pt today before dc.  Manuela Schwartz said she was not SW for MB today but would find out coverage and call us back.

## 2019-02-12 NOTE — Progress Notes (Signed)
Mom decided to leave without having dc paperwork.  (FOB had job interview at [employer] and didn't want to miss)  Encouraged mom to make f/u appt at [facility] for 6 wks appointment.  Reviewed the red flag warning signs and to notify provider as needed.  Discussed dietary foods that are high in K+ to incorportate into diet. Mom verb u/o.  To car via wc with staff.

## 2019-02-12 NOTE — Progress Notes (Signed)
CSW made [place] CPS report for infant's positive UDS for THC.   Moneka Mcquinn S. Merek Niu, MSW, LCSW [facility] [phone]     

## 2019-02-12 NOTE — Progress Notes (Signed)
Mom stating she is going to leave with or without an order from dr.  Encouraged her to let us talk with provider

## 2019-02-12 NOTE — Progress Notes (Signed)
Pt states "I'm ready to leave"  "take my IV out my baby's discharge is ready"  "I'm ready to go home" IV d/c'ed.

## 2019-02-12 NOTE — Clinical Social Work Maternal (Addendum)
CLINICAL SOCIAL WORK MATERNAL/CHILD NOTE  Patient Details  Name: Cheryl Mccullough MRN: [id number] Date of Birth: 10/12/1985  Date:  02/12/2019  Clinical Social Worker Initiating Note:  Jaianna Nicoll, LCSW Date/Time: Initiated:  02/12/19/1125     Child's Name:  Cheryl Mccullough   Biological Parents:  Mother   Need for Interpreter:  None   Reason for Referral:  Current Substance Use/Substance Use During Pregnancy    Address:  [address] [place] [address]    Phone number:  [phone] (home)     Additional phone number: none   Household Members/Support Persons (HM/SP):   Household Member/Support Person 3, Household Member/Support Person 1   HM/SP Name Relationship DOB or Age  HM/SP -1 Ginnie Beane MOB  31  HM/SP -2   William Mccullough  FOB     HM/SP -3 Dylan Alcorta Son   6  HM/SP -4   Luke Mccullough  Son   14 months   HM/SP -5   Logan Traum  son   11  HM/SP -6        HM/SP -7        HM/SP -8          Natural Supports (not living in the home):      Professional Supports: None   Employment: Unemployed   Type of Work: none   Education:  Other (comment)(GED.)   Homebound arranged:  n/a  Financial Resources:  Medicaid   Other Resources:  Food Stamps    Cultural/Religious Considerations Which May Impact Care:  none reported.   Strengths:  Ability to meet basic needs , Compliance with medical plan , Home prepared for child , Pediatrician chosen   Psychotropic Medications:     None     Pediatrician:    [place]  Pediatrician List:   [place]    [place]    [place] ([facility])  [place]    [place]    [place]      Pediatrician Fax Number:    Risk Factors/Current Problems:  Substance Use    Cognitive State:  Able to Concentrate , Alert    Mood/Affect:  Calm , Relaxed , Interested    CSW Assessment: CSW consulted as MOB had LPNC and THC use while pregnant. CSW called into room to speak with MOB to address further  needs.   CSW congratulated on birth of infant. CSW advised MOB of the reason for CSW calling. CSW advised MOB of CSW's role and the reason for CSW calling to speak with her. MOB reported that she did use THC about three months ago. CSW understanding and advised MOB that infants UDS is positive for THC. CSW advised MOB that CSW wouldneed to make CPS report to [place]. MOB reported that she understood and reported that she had previous report. With CPS for the same reason. MOB reported that cases have since been closed. CSW advised MOB that CSW would make new report and have them follow up with her. MOB agreeable. Mob reported that she has no mental health concerns and reports that she is not feeling SI or HI at this time.   CSW asked MOB about why she received  care late. MOB reported that she started care at about 24 weeks and that was just it. CSW understanding and reported that this is another reason why hospital would drug screen baby. MOB reported that she only used THC during pregnancy and no other substances.   CSW   provided MOB with PPD and SIDS education. MOB was given signs and syptoms to look for in the event that PPD began. MOB thanked CSW and reported that she has all needed items to care for infant at this time.   CSW has made [facility] CPS report. At this time there are no barriers to infant discharging with MOB. CSW will monitor CDS at this time for further needs.   CSW Plan/Description:  No Further Intervention Required/No Barriers to Discharge, [place], CSW Will Continue to Monitor Umbilical Cord Tissue Drug Screen Results and Make Report if Warranted, Child Protective Service Report , Perinatal Mood and Anxiety Disorder (PMADs) Education, Sudden Infant Death Syndrome (SIDS) Education    Wetzel Bjornstad, [place] 05/11/18, 12:43 PM

## 2020-03-04 NOTE — L&D Delivery Note (Signed)
Delivery Note At 6:36 AM a viable female was delivered via Vaginal, Spontaneous (Presentation: Right Occiput Anterior).  APGAR: 8, 9; weight pending.   Placenta status: Spontaneous, Intact.  Cord: 3 vessels with the following complications: None.  Cord pH: n/a  Anesthesia: None Episiotomy: None Lacerations: None Suture Repair:  n/a Est. Blood Loss (mL):  350, QBL: pending  Mom to postpartum.  Baby to Couplet care / Skin to Skin.  Called to see patient.  Mom pushed to deliver a viable female infant.  The head followed by shoulders, which delivered without difficulty, and the rest of the body.  A double nuchal cord noted and delivered through.  Baby to mom's chest.  Cord clamped and cut after > 1 min delay.  Cord blood obtained.  Placenta delivered spontaneously, intact, with a 3-vessel cord.  No vaginal, cervical, or perineal lacerations. All counts correct.  Hemostasis obtained with IV pitocin and fundal massage. EBL 350 mL. QBL pending.     Thomasene Mohair, MD 11/08/2020, 7:04 AM

## 2020-06-12 ENCOUNTER — Other Ambulatory Visit (HOSPITAL_COMMUNITY)
Admission: RE | Admit: 2020-06-12 | Discharge: 2020-06-12 | Disposition: A | Discharge status: Home / Self Care | Payer: Medicaid Other | Source: Ambulatory Visit | Attending: Obstetrics and Gynecology | Admitting: Obstetrics and Gynecology

## 2020-06-12 ENCOUNTER — Ambulatory Visit (INDEPENDENT_AMBULATORY_CARE_PROVIDER_SITE_OTHER): Payer: Medicaid Other | Admitting: Obstetrics and Gynecology

## 2020-06-12 ENCOUNTER — Other Ambulatory Visit: Payer: Self-pay

## 2020-06-12 ENCOUNTER — Encounter: Payer: Self-pay | Admitting: Obstetrics and Gynecology

## 2020-06-12 VITALS — BP 100/60 | Wt 138.0 lb

## 2020-06-12 DIAGNOSIS — F17210 Nicotine dependence, cigarettes, uncomplicated: Secondary | ICD-10-CM

## 2020-06-12 DIAGNOSIS — Z3A18 18 weeks gestation of pregnancy: Secondary | ICD-10-CM

## 2020-06-12 DIAGNOSIS — O0931 Supervision of pregnancy with insufficient antenatal care, first trimester: Secondary | ICD-10-CM

## 2020-06-12 DIAGNOSIS — Z113 Encounter for screening for infections with a predominantly sexual mode of transmission: Secondary | ICD-10-CM

## 2020-06-12 DIAGNOSIS — Z7185 Encounter for immunization safety counseling: Secondary | ICD-10-CM

## 2020-06-12 DIAGNOSIS — O99330 Smoking (tobacco) complicating pregnancy, unspecified trimester: Secondary | ICD-10-CM

## 2020-06-12 DIAGNOSIS — O99331 Smoking (tobacco) complicating pregnancy, first trimester: Secondary | ICD-10-CM

## 2020-06-12 DIAGNOSIS — Z3480 Encounter for supervision of other normal pregnancy, unspecified trimester: Secondary | ICD-10-CM | POA: Insufficient documentation

## 2020-06-12 DIAGNOSIS — O281 Abnormal biochemical finding on antenatal screening of mother: Secondary | ICD-10-CM

## 2020-06-12 DIAGNOSIS — O0930 Supervision of pregnancy with insufficient antenatal care, unspecified trimester: Secondary | ICD-10-CM

## 2020-06-12 MED ORDER — CITRANATAL ASSURE 35-1 & 300 MG PO MISC: 2.0000 | Freq: Every day | ORAL | 3 refills | Status: DC

## 2020-06-12 MED ORDER — NICOTINE 7 MG/24HR TD PT24: 7.0000 mg | MEDICATED_PATCH | Freq: Every day | TRANSDERMAL | 0 refills | Status: DC

## 2020-06-12 NOTE — Progress Notes (Signed)
New Obstetric Patient H&P    Chief Complaint: Missed period, S&S of pregnancy, +UTP   History of Present Illness: Patient is a 35 y.o. [id number] Not Hispanic or Latino female, presents with amenorrhea and positive home pregnancy test. Patient's last menstrual period was 02/07/2020 (approximate). and based on her  LMP, her EDD is Estimated Date of Delivery: 11/13/20 and her EGA is [redacted]w[redacted]d. However, the patient's menstrual cycles are very irregular, patient is unable to describe frequency of her cycles. Her last pap smear was 2 years ago and was NILM.    She had a urine pregnancy test which was positive 4 to 6 weeks ago. Her last menstrual cycle was irregular. Since her LMP she claims she has experienced an increase in N/V which has subsequently decreased, and fatigue. She denies vaginal bleeding. Her past medical history is significant for tobacco use. Her prior pregnancies are notable for NSVDs, patient required IOL for G3 (postdates, per patient), and augmentation for G4 due to PROM. Her pelvis has been proven to 6lb 9oz.  Since her LMP, she admits to the use of tobacco products  Yes - Currently down to 1/2 PPD, requesting support with cessation She claims she has gained   13lb pounds since the start of her pregnancy.  There are cats in the home in the home  no  She admits close contact with children on a regular basis  yes  She has had chicken pox in the past yes She has had Tuberculosis exposures, symptoms, or previously tested positive for TB   no Current or past history of domestic violence. no  Genetic Screening/Teratology Counseling: (Includes patient, baby's father, or anyone in either family with:)   1. Patient's age >/= 87 at [facility]  no 2. Thalassemia ([place], [place], Mediterranean, or Asian background): MCV<80  no 3. Neural tube defect (meningomyelocele, spina bifida, anencephaly)  no 4. Congenital heart defect  no  5. Down syndrome  no 6. Tay-Sachs (Jewish, [place]  no 7.  Canavan's Disease  no 8. Sickle cell disease or trait (African)  no  9. Hemophilia or other blood disorders  no  10. Muscular dystrophy  no  11. Cystic fibrosis  no  12. Huntington's Chorea  no  13. Mental retardation/autism  no 14. Other inherited genetic or chromosomal disorder  no 15. Maternal metabolic disorder (DM, PKU, etc)  no 16. Patient or FOB with a child with a birth defect not listed above no  16a. Patient or FOB with a birth defect themselves no 17. Recurrent pregnancy loss, or stillbirth  no  18. Any medications since LMP other than prenatal vitamins (include vitamins, supplements, OTC meds, drugs, alcohol)  Patient reports not taking PNV 19. Any other genetic/environmental exposure to discuss  no  Infection History:   1. Lives with someone with TB or TB exposed  no  2. Patient or partner has history of genital herpes  no 3. Rash or viral illness since LMP  no 4. History of STI (GC, CT, HPV, syphilis, HIV)  no 5. History of recent travel :  no  Other pertinent information:  Lives with boyfriend, Chrissie Noa, and her four children   Review of Systems:10 point review of systems negative unless otherwise noted in HPI  Past Medical History:  Patient Active Problem List   Diagnosis Date Noted  . Encounter for supervision of other normal pregnancy, unspecified trimester 06/12/2020     Nursing Staff Provider  Office Location  [facility] Dating   LMP (irregular,  [place] ordered)  Language  English Anatomy US    Flu Vaccine   declined Genetic Screen  NIPS:   Considering  TDaP vaccine    Hgb A1C or  GTT Early : n/a Third trimester :   Rhogam     LAB RESULTS   Feeding Plan  formula Blood Type     Contraception  Antibody    Circumcision  Rubella    Pediatrician   RPR     Support Person  Chrissie Noa - boyfriend HBsAg     Prenatal Classes  HIV      Varicella   BTL Consent  GBS  (For PCN allergy, check sensitivities)        VBAC Consent  n/a Pap  NILM 2020    Hgb Electro   n/a     CF      SMA            . Tobacco use affecting pregnancy, antepartum 06/12/2020    Past Surgical History:  Past Surgical History:  Procedure Laterality Date  . OVARIAN CYST REMOVAL Right     Gynecologic History: Patient's last menstrual period was 02/07/2020 (approximate).  Obstetric History: [id number]  Family History:  Family History  Problem Relation Age of Onset  . Breast cancer Mother 36    Social History:  Social History   Socioeconomic History  . Marital status: Single    Spouse name: Chrissie Noa  . Number of children: Not on file  . Years of education: Not on file  . Highest education level: Not on file  Occupational History  . Not on file  Tobacco Use  . Smoking status: Current Some Day Smoker    Packs/day: 0.50    Types: Cigarettes  . Smokeless tobacco: Never Used  Vaping Use  . Vaping Use: Never used  Substance and Sexual Activity  . Alcohol use: Never  . Drug use: Never  . Sexual activity: Yes    Partners: Male    Birth control/protection: None    Comment: possible Vasectomy  Other Topics Concern  . Not on file  Social History Narrative  . Not on file   Social Determinants of Health   Financial Resource Strain: Not on file  Food Insecurity: Not on file  Transportation Needs: Not on file  Physical Activity: Not on file  Stress: Not on file  Social Connections: Not on file  Intimate Partner Violence: Not on file    Allergies:  Allergies  Allergen Reactions  . Penicillins Other (See Comments)    Other Reaction: Not Assessed  . Sulfa Antibiotics Other (See Comments)    Other Reaction: Not Assessed  . Wellbutrin [Bupropion]     Medications: Prior to Admission medications   Medication Sig Start Date End Date Taking? Authorizing Provider  nicotine (NICODERM CQ) 7 mg/24hr patch Place 1 patch (7 mg total) onto the skin daily. 06/12/20  Yes Elier Zellars, CNM  Prenat w/o A-[id number]-[id number] (CITRANATAL ASSURE) 35-1 & 300 MG tablet Take 2  tablets by mouth daily. 06/12/20  Yes Zipporah Plants, CNM    Physical Exam Vitals: Blood pressure 100/60, weight 138 lb (62.6 kg), last menstrual period 02/07/2020, unknown if currently breastfeeding.  General: NAD HEENT: normocephalic, anicteric Thyroid: no enlargement, no palpable nodules Pulmonary: No increased work of breathing, CTAB Cardiovascular: RRR, distal pulses 2+ Abdomen: NABS, soft, non-tender, non-distended.  Umbilicus without lesions.  No hepatomegaly, splenomegaly or masses palpable. No evidence of hernia. Fundal height between 16-18 cms. FHT auscultated - 150s Genitourinary:  External: Normal external female genitalia.  Normal urethral meatus, normal  Bartholin's and Skene's glands.    Vagina: Normal vaginal mucosa, no evidence of prolapse.    Cervix: Grossly normal in appearance, no bleeding  Deferred bimanual exam  Rectal: deferred Extremities: no edema, erythema, or tenderness Neurologic: Grossly intact Psychiatric: mood appropriate, affect full   Assessment: 35 y.o. G5P4004 at [redacted]w[redacted]d presenting to initiate prenatal care  Plan: 1) Avoid alcoholic beverages. 2) Patient encouraged not to smoke. Nicotine patch rx'd. Patient praised for decreasing recent use. 3) Discontinue the use of all non-medicinal drugs and chemicals.  4) Take prenatal vitamins daily. Rx sent to pharmacy. 5) Nutrition, food safety (fish, cheese advisories, and high nitrite foods) and exercise discussed. 6) Hospital and practice style discussed with cross coverage system.  7) Genetic Screening, such as with 1st Trimester Screening, cell free fetal DNA, AFP testing, and Ultrasound, as well as with amniocentesis and CVS as appropriate, is discussed with patient. At the conclusion of today's visit patient undecided genetic testing  8) NOB labs/GC/CT/urine cx obtained  9) Late to care- current EDD on irregular LMP - [place] ordered to be completed ASAP  Return in about 2 weeks (around 06/26/2020) for ROB  - late to care - *Needs anatomay [place] ASAP*.  Zipporah Plants, CNM, MSN [facility], [facility] 06/12/2020, 11:48 AM

## 2020-06-13 LAB — CERVICOVAGINAL ANCILLARY ONLY
Chlamydia: NEGATIVE
Comment: NEGATIVE
Comment: NEGATIVE
Comment: NORMAL
Neisseria Gonorrhea: NEGATIVE
Trichomonas: NEGATIVE

## 2020-06-13 LAB — RPR+RH+ABO+RUB AB+AB SCR+CB...
Antibody Screen: NEGATIVE
HIV Screen 4th Generation wRfx: NONREACTIVE
Hematocrit: 31.8 % — ABNORMAL LOW (ref 34.0–46.6)
Hemoglobin: 10.8 g/dL — ABNORMAL LOW (ref 11.1–15.9)
Hepatitis B Surface Ag: NEGATIVE
MCH: 30.7 pg (ref 26.6–33.0)
MCHC: 34 g/dL (ref 31.5–35.7)
MCV: 90 fL (ref 79–97)
Platelets: 249 10*3/uL (ref 150–450)
RBC: 3.52 x10E6/uL — ABNORMAL LOW (ref 3.77–5.28)
RDW: 14.3 % (ref 11.7–15.4)
RPR Ser Ql: NONREACTIVE
Rh Factor: POSITIVE
Rubella Antibodies, IGG: 3.37 index (ref >0.99)
Varicella zoster IgG: 1351 index (ref >165)
WBC: 9.7 10*3/uL (ref 3.4–10.8)

## 2020-06-14 LAB — URINE CULTURE

## 2020-06-15 ENCOUNTER — Telehealth: Payer: Self-pay | Admitting: Obstetrics and Gynecology

## 2020-06-15 DIAGNOSIS — O99019 Anemia complicating pregnancy, unspecified trimester: Secondary | ICD-10-CM

## 2020-06-15 DIAGNOSIS — O2342 Unspecified infection of urinary tract in pregnancy, second trimester: Secondary | ICD-10-CM

## 2020-06-15 MED ORDER — FERROUS SULFATE 325 (65 FE) MG PO TABS
325.0000 mg | ORAL_TABLET | Freq: Every day | ORAL | 3 refills | Status: DC
Start: 2020-06-15 — End: 2020-11-09

## 2020-06-15 MED ORDER — NITROFURANTOIN MONOHYD MACRO 100 MG PO CAPS: 100.0000 mg | ORAL_CAPSULE | Freq: Two times a day (BID) | ORAL | 0 refills | Status: DC

## 2020-06-15 NOTE — Telephone Encounter (Signed)
Spoke with patient via phone. Utilized two identifiers. Reviewed lab findings for NOB. Rx'd anitbiotic for UTI, fe supplementation for H&H. Discussed treatment plan with patient. All questions answered.

## 2020-06-15 NOTE — Telephone Encounter (Signed)
Left generic voicemail for patient to follow-up regarding recent visit. Will attempt call-back.

## 2020-06-15 NOTE — Addendum Note (Signed)
Addended byZipporah Plants on: 06/15/2020 05:05 PM   Modules accepted: Orders

## 2020-06-23 ENCOUNTER — Other Ambulatory Visit: Payer: Self-pay

## 2020-06-23 ENCOUNTER — Ambulatory Visit (INDEPENDENT_AMBULATORY_CARE_PROVIDER_SITE_OTHER): Payer: Medicaid Other | Admitting: Obstetrics and Gynecology

## 2020-06-23 ENCOUNTER — Encounter: Payer: Self-pay | Admitting: Obstetrics and Gynecology

## 2020-06-23 VITALS — BP 102/64 | Wt 142.0 lb

## 2020-06-23 DIAGNOSIS — O09892 Supervision of other high risk pregnancies, second trimester: Secondary | ICD-10-CM

## 2020-06-23 DIAGNOSIS — Z3A20 20 weeks gestation of pregnancy: Secondary | ICD-10-CM

## 2020-06-23 NOTE — Progress Notes (Signed)
  Routine Prenatal Care Visit  Subjective  Cheryl Mccullough is a 35 y.o. G5P4004 at [redacted]w[redacted]d being seen today for ongoing prenatal care.  She is currently monitored for the following issues for this low-risk pregnancy and has Encounter for supervision of other normal pregnancy, unspecified trimester and Tobacco use affecting pregnancy, antepartum on their problem list.  ----------------------------------------------------------------------------------- Patient reports no complaints.   Contractions: Not present. Vag. Bleeding: None.  Movement: Present. Leaking Fluid denies.  ----------------------------------------------------------------------------------- The following portions of the patient's history were reviewed and updated as appropriate: allergies, current medications, past family history, past medical history, past social history, past surgical history and problem list. Problem list updated.  Objective  Blood pressure 102/64, weight 142 lb (64.4 kg), unknown if currently breastfeeding. Pregravid weight 125 lb (56.7 kg) Total Weight Gain 17 lb (7.711 kg) Urinalysis: Urine Protein    Urine Glucose    Fetal Status: Fetal Heart Rate (bpm): 150   Movement: Present     General:  Alert, oriented and cooperative. Patient is in no acute distress.  Skin: Skin is warm and dry. No rash noted.   Cardiovascular: Normal heart rate noted  Respiratory: Normal respiratory effort, no problems with respiration noted  Abdomen: Soft, gravid, appropriate for gestational age. Pain/Pressure: Absent     Pelvic:  Cervical exam deferred        Extremities: Normal range of motion.     Mental Status: Normal mood and affect. Normal behavior. Normal judgment and thought content.   Assessment   35 y.o. G5P4004 at [redacted]w[redacted]d by  11/07/2020, by Ultrasound presenting for routine prenatal visit  Plan   FIFTH Problems (from 06/12/20 to present)    Problem Noted Resolved   Encounter for supervision of other normal pregnancy,  unspecified trimester 06/12/2020 by Zipporah Plants, CNM No   Overview Signed 06/12/2020 11:48 AM by Zipporah Plants, CNM     Nursing Staff Provider  Office Location  [facility] Dating   LMP (irregular, [place] ordered)  Language  English Anatomy US    Flu Vaccine   declined Genetic Screen  NIPS:   Considering  TDaP vaccine    Hgb A1C or  GTT Early : n/a Third trimester :   Rhogam     LAB RESULTS   Feeding Plan  formula Blood Type     Contraception  Antibody    Circumcision  Rubella    Pediatrician   RPR     Support Person  Chrissie Noa - boyfriend HBsAg     Prenatal Classes  HIV      Varicella   BTL Consent  GBS  (For PCN allergy, check sensitivities)        VBAC Consent  n/a Pap  NILM 2020    Hgb Electro   n/a    CF      SMA                   Preterm labor symptoms and general obstetric precautions including but not limited to vaginal bleeding, contractions, leaking of fluid and fetal movement were reviewed in detail with the patient. Please refer to After Visit Summary for other counseling recommendations.   - Dating changed today as LMP was uncertain.   - Keep anatomy u/s in May.   Return in about 4 weeks (around 07/21/2020) for Routine Prenatal Appointment (also keep u/s appointment).   Thomasene Mohair, MD, [facility], [facility] 06/23/2020 11:10 AM

## 2020-06-26 ENCOUNTER — Encounter: Payer: Medicaid Other | Admitting: Obstetrics

## 2020-06-27 NOTE — Progress Notes (Signed)
ULTRASOUND REPORT  Location: [facility] Date of Service: 06/27/2020   Indications:Unsure LMP, dating Findings:  Singleton intrauterine pregnancy is visualized with fetal biometrics consistent with [redacted]w[redacted]d gestation, giving an (U/S) EDD of 11/07/2020.  FHR: 150 BPM For biometrics, see images Yolk sac is not visualized. Amnion: not visualized   Right Ovary is not visualized. Left Ovary  is not visualized. Corpus luteal cyst:  is not visualized Survey of the adnexa demonstrates no adnexal masses. There is no free peritoneal fluid in the cul de sac.  Impression: 1. [redacted]w[redacted]d Viable Singleton Intrauterine pregnancy by U/S. 2. (U/S) EDD is consistent with Clinically established EDD of 11/07/2020.  Recommendations: 1.Clinical correlation with the patient's History and Physical Exam. 2. Keep previously scheduled anatomy ultrasound appointment.  The ultrasound was performed by me and the images were interpreted by me.   Thomasene Mohair, MD, [facility], [facility] 06/23/2020 11:30 AM

## 2020-07-12 ENCOUNTER — Other Ambulatory Visit: Payer: Self-pay

## 2020-07-12 ENCOUNTER — Ambulatory Visit
Admission: RE | Admit: 2020-07-12 | Discharge: 2020-07-12 | Disposition: A | Discharge status: Home / Self Care | Payer: Medicaid Other | Source: Ambulatory Visit | Attending: Obstetrics and Gynecology | Admitting: Obstetrics and Gynecology

## 2020-07-12 DIAGNOSIS — Z3480 Encounter for supervision of other normal pregnancy, unspecified trimester: Secondary | ICD-10-CM | POA: Insufficient documentation

## 2020-07-17 ENCOUNTER — Encounter: Payer: Medicaid Other | Admitting: Obstetrics and Gynecology

## 2020-07-17 ENCOUNTER — Other Ambulatory Visit: Payer: Self-pay

## 2020-10-04 ENCOUNTER — Telehealth: Payer: Self-pay

## 2020-10-04 NOTE — Telephone Encounter (Signed)
Pt calling; is 19m preg; feels extremely bad - muscles and veins are killing her; has an infected tooth; has pop up bruises; keeps losing circulation.  Went to [facility] and they wouldn't do anything b/c she is pregnant.  Is trying to get help today.  [phone]  VM not set up yet.  (ED; has appt tomorrow)

## 2020-10-05 ENCOUNTER — Encounter: Payer: Medicaid Other | Admitting: Obstetrics and Gynecology

## 2020-11-08 ENCOUNTER — Inpatient Hospital Stay
Admission: EM | Admit: 2020-11-08 | Discharge: 2020-11-09 | DRG: 806 | Disposition: A | Discharge status: Home / Self Care | Payer: Medicaid Other | Attending: Obstetrics and Gynecology | Admitting: Obstetrics and Gynecology

## 2020-11-08 ENCOUNTER — Encounter: Payer: Self-pay | Admitting: Obstetrics and Gynecology

## 2020-11-08 ENCOUNTER — Other Ambulatory Visit: Payer: Self-pay

## 2020-11-08 DIAGNOSIS — O0933 Supervision of pregnancy with insufficient antenatal care, third trimester: Secondary | ICD-10-CM

## 2020-11-08 DIAGNOSIS — O480 Post-term pregnancy: Secondary | ICD-10-CM | POA: Diagnosis not present

## 2020-11-08 DIAGNOSIS — O9081 Anemia of the puerperium: Secondary | ICD-10-CM | POA: Diagnosis not present

## 2020-11-08 DIAGNOSIS — D62 Acute posthemorrhagic anemia: Secondary | ICD-10-CM | POA: Diagnosis not present

## 2020-11-08 DIAGNOSIS — O26893 Other specified pregnancy related conditions, third trimester: Secondary | ICD-10-CM | POA: Diagnosis present

## 2020-11-08 DIAGNOSIS — Z3A40 40 weeks gestation of pregnancy: Secondary | ICD-10-CM

## 2020-11-08 DIAGNOSIS — Z20822 Contact with and (suspected) exposure to covid-19: Secondary | ICD-10-CM | POA: Diagnosis present

## 2020-11-08 DIAGNOSIS — O99334 Smoking (tobacco) complicating childbirth: Secondary | ICD-10-CM | POA: Diagnosis present

## 2020-11-08 DIAGNOSIS — Z3480 Encounter for supervision of other normal pregnancy, unspecified trimester: Secondary | ICD-10-CM

## 2020-11-08 DIAGNOSIS — O0930 Supervision of pregnancy with insufficient antenatal care, unspecified trimester: Secondary | ICD-10-CM

## 2020-11-08 DIAGNOSIS — F17210 Nicotine dependence, cigarettes, uncomplicated: Secondary | ICD-10-CM | POA: Diagnosis present

## 2020-11-08 LAB — CBC
HCT: 30.8 % — ABNORMAL LOW (ref 36.0–46.0)
Hemoglobin: 10.6 g/dL — ABNORMAL LOW (ref 12.0–15.0)
MCH: 29.7 pg (ref 26.0–34.0)
MCHC: 34.4 g/dL (ref 30.0–36.0)
MCV: 86.3 fL (ref 80.0–100.0)
Platelets: 284 10*3/uL (ref 150–400)
RBC: 3.57 MIL/uL — ABNORMAL LOW (ref 3.87–5.11)
RDW: 13.9 % (ref 11.5–15.5)
WBC: 13.8 10*3/uL — ABNORMAL HIGH (ref 4.0–10.5)
nRBC: 0 % (ref 0.0–0.2)

## 2020-11-08 LAB — URINE DRUG SCREEN, QUALITATIVE (ARMC ONLY)
Amphetamines, Ur Screen: NOT DETECTED
Barbiturates, Ur Screen: NOT DETECTED
Benzodiazepine, Ur Scrn: NOT DETECTED
Cannabinoid 50 Ng, Ur ~~LOC~~: POSITIVE — AB
Cocaine Metabolite,Ur ~~LOC~~: NOT DETECTED
MDMA (Ecstasy)Ur Screen: NOT DETECTED
Methadone Scn, Ur: NOT DETECTED
Opiate, Ur Screen: NOT DETECTED
Phencyclidine (PCP) Ur S: NOT DETECTED
Tricyclic, Ur Screen: NOT DETECTED

## 2020-11-08 LAB — RESP PANEL BY RT-PCR (FLU A&B, COVID) ARPGX2
Influenza A by PCR: NEGATIVE
Influenza B by PCR: NEGATIVE
SARS Coronavirus 2 by RT PCR: NEGATIVE

## 2020-11-08 LAB — TYPE AND SCREEN
ABO/RH(D): O POS
Antibody Screen: NEGATIVE

## 2020-11-08 LAB — RPR: RPR Ser Ql: NONREACTIVE

## 2020-11-08 MED ORDER — PRENATAL MULTIVITAMIN CH
1.0000 | ORAL_TABLET | Freq: Every day | ORAL | Status: DC
  Administered 2020-11-08 – 2020-11-09 (×2): 1 via ORAL
  Filled 2020-11-08 (×2): qty 1

## 2020-11-08 MED ORDER — OXYTOCIN-SODIUM CHLORIDE 30-0.9 UT/500ML-% IV SOLN
INTRAVENOUS | Status: AC
  Filled 2020-11-08: qty 500

## 2020-11-08 MED ORDER — SENNOSIDES-DOCUSATE SODIUM 8.6-50 MG PO TABS
2.0000 | ORAL_TABLET | ORAL | Status: DC
  Administered 2020-11-08 – 2020-11-09 (×2): 2 via ORAL
  Filled 2020-11-08 (×2): qty 2

## 2020-11-08 MED ORDER — OXYTOCIN BOLUS FROM INFUSION
333.0000 mL | Freq: Once | INTRAVENOUS | Status: AC
  Administered 2020-11-08: 333 mL via INTRAVENOUS

## 2020-11-08 MED ORDER — FERROUS SULFATE 325 (65 FE) MG PO TABS
325.0000 mg | ORAL_TABLET | Freq: Two times a day (BID) | ORAL | Status: DC
  Administered 2020-11-08 – 2020-11-09 (×3): 325 mg via ORAL
  Filled 2020-11-08 (×3): qty 1

## 2020-11-08 MED ORDER — AMMONIA AROMATIC IN INHA
RESPIRATORY_TRACT | Status: AC
  Filled 2020-11-08: qty 10

## 2020-11-08 MED ORDER — LIDOCAINE HCL (PF) 1 % IJ SOLN
INTRAMUSCULAR | Status: AC
  Filled 2020-11-08: qty 30

## 2020-11-08 MED ORDER — DIBUCAINE (PERIANAL) 1 % EX OINT: 1.0000 "application " | TOPICAL_OINTMENT | CUTANEOUS | Status: DC | PRN

## 2020-11-08 MED ORDER — DIPHENHYDRAMINE HCL 25 MG PO CAPS: 25.0000 mg | ORAL_CAPSULE | Freq: Four times a day (QID) | ORAL | Status: DC | PRN

## 2020-11-08 MED ORDER — LACTATED RINGERS IV SOLN: INTRAVENOUS | Status: DC

## 2020-11-08 MED ORDER — IBUPROFEN 600 MG PO TABS
600.0000 mg | ORAL_TABLET | Freq: Four times a day (QID) | ORAL | Status: DC
  Administered 2020-11-08 – 2020-11-09 (×6): 600 mg via ORAL
  Filled 2020-11-08 (×6): qty 1

## 2020-11-08 MED ORDER — SOD CITRATE-CITRIC ACID 500-334 MG/5ML PO SOLN: 30.0000 mL | ORAL | Status: DC | PRN

## 2020-11-08 MED ORDER — LIDOCAINE HCL (PF) 1 % IJ SOLN: 30.0000 mL | INTRAMUSCULAR | Status: DC | PRN

## 2020-11-08 MED ORDER — SIMETHICONE 80 MG PO CHEW
80.0000 mg | CHEWABLE_TABLET | ORAL | Status: DC | PRN
  Administered 2020-11-09: 80 mg via ORAL
  Filled 2020-11-08: qty 1

## 2020-11-08 MED ORDER — LACTATED RINGERS IV SOLN: 500.0000 mL | INTRAVENOUS | Status: DC | PRN

## 2020-11-08 MED ORDER — BENZOCAINE-MENTHOL 20-0.5 % EX AERO: 1.0000 "application " | INHALATION_SPRAY | CUTANEOUS | Status: DC | PRN

## 2020-11-08 MED ORDER — OXYTOCIN 10 UNIT/ML IJ SOLN
INTRAMUSCULAR | Status: AC
  Filled 2020-11-08: qty 2

## 2020-11-08 MED ORDER — ONDANSETRON HCL 4 MG/2ML IJ SOLN: 4.0000 mg | INTRAMUSCULAR | Status: DC | PRN

## 2020-11-08 MED ORDER — ONDANSETRON HCL 4 MG PO TABS
4.0000 mg | ORAL_TABLET | ORAL | Status: DC | PRN
  Administered 2020-11-08: 4 mg via ORAL
  Filled 2020-11-08 (×2): qty 1

## 2020-11-08 MED ORDER — ACETAMINOPHEN 325 MG PO TABS: 650.0000 mg | ORAL_TABLET | ORAL | Status: DC | PRN

## 2020-11-08 MED ORDER — OXYTOCIN 10 UNIT/ML IJ SOLN: 10.0000 [IU] | Freq: Once | INTRAMUSCULAR | Status: DC

## 2020-11-08 MED ORDER — COCONUT OIL OIL: 1.0000 "application " | TOPICAL_OIL | Status: DC | PRN

## 2020-11-08 MED ORDER — OXYTOCIN-SODIUM CHLORIDE 30-0.9 UT/500ML-% IV SOLN: 2.5000 [IU]/h | INTRAVENOUS | Status: DC

## 2020-11-08 MED ORDER — ONDANSETRON HCL 4 MG/2ML IJ SOLN: 4.0000 mg | Freq: Four times a day (QID) | INTRAMUSCULAR | Status: DC | PRN

## 2020-11-08 MED ORDER — MISOPROSTOL 200 MCG PO TABS
ORAL_TABLET | ORAL | Status: AC
  Filled 2020-11-08: qty 4

## 2020-11-08 MED ORDER — WITCH HAZEL-GLYCERIN EX PADS: 1.0000 "application " | MEDICATED_PAD | CUTANEOUS | Status: DC | PRN

## 2020-11-08 NOTE — Discharge Summary (Signed)
Postpartum Discharge Summary    Patient Name: Cheryl Mccullough DOB: [birth year] MRN: [id number]  Date of admission: 11/08/2020 Delivery date:11/08/2020  Delivering provider: Prentice Docker D  Date of discharge: 11/09/2020  Admitting diagnosis: Normal labor [O80, Z37.9] Intrauterine pregnancy: [redacted]w[redacted]d    Secondary diagnosis:  Principal Problem:   Supervision of other normal pregnancy, antepartum Active Problems:   Postpartum care following vaginal delivery   Normal labor   Limited prenatal care   [redacted] weeks gestation of pregnancy  Additional problems: none    Discharge diagnosis: Term Pregnancy Delivered                                              Post partum procedures: none Augmentation:  none Complications: None  Hospital course: Onset of Labor With Vaginal Delivery      35y.o. yo [id number] 444w1das admitted in Active Labor on 11/08/2020. Patient had an uncomplicated labor course as follows:  Membrane Rupture Time/Date: 6:25 AM ,11/08/2020   Delivery Method:Vaginal, Spontaneous  Episiotomy: None  Lacerations:  None  Patient had an uncomplicated postpartum course.  She is ambulating, tolerating a regular diet, passing flatus, and urinating well. Patient is discharged home in stable condition on 11/09/20.  Newborn Data: Birth date:11/08/2020  Birth time:6:36 AM  Gender:Female  Living status:Living  Apgars:8 ,9  Weight:3010 g   Magnesium Sulfate received: No BMZ received: No Rhophylac:N/A MMR:N/A T-DaP: did not receive Flu: did not receive Transfusion:No  Physical exam  Vitals:   11/08/20 1822 11/08/20 1959 11/08/20 2257 11/09/20 0743  BP: 100/68 103/71 117/74 112/88  Pulse: 68 (!) 55 (!) 59 60  Resp:  _0 Temp: (!) 97.5 F (36.4 C) 98.7 F (37.1 C) 98.6 F (37 C) 98.3 F (36.8 C)  TempSrc: Oral Oral Oral Oral  SpO2: 98% 96% 100% 99%  Weight:      Height:       General: alert, cooperative, and no distress Lochia: appropriate Uterine Fundus: firm Incision:  N/A DVT Evaluation: No evidence of DVT seen on physical exam. Labs: Lab Results  Component Value Date   WBC 8.8 11/09/2020   HGB 9.1 (L) 11/09/2020   HCT 27.5 (L) 11/09/2020   MCV 86.5 11/09/2020   PLT 238 11/09/2020   CMP Latest Ref Rng & Units 02/12/2019  Glucose 70 - 99 mg/dL 75  BUN 6 - 20 mg/dL 7  Creatinine 0.44 - 1.00 mg/dL 0.96  Sodium 135 - 145 mmol/L 141  Potassium 3.5 - 5.1 mmol/L 2.7(LL)  Chloride 98 - 111 mmol/L 110  CO2 22 - 32 mmol/L 23  Calcium 8.9 - 10.3 mg/dL 8.9  Total Protein 6.5 - 8.1 g/dL -  Total Bilirubin 0.3 - 1.2 mg/dL -  Alkaline Phos 38 - 126 U/L -  AST 15 - 41 U/L -  ALT 0 - 44 U/L -   Edinburgh Score: Edinburgh Postnatal Depression Scale Screening Tool 11/08/2020  I have been able to laugh and see the funny side of things. 0  I have looked forward with enjoyment to things. 0  I have blamed myself unnecessarily when things went wrong. 1  I have been anxious or worried for no good reason. 1  I have felt scared or panicky for no good reason. 0  Things have been getting on top of me. 1  I have been so  unhappy that I have had difficulty sleeping. 0  I have felt sad or miserable. 0  I have been so unhappy that I have been crying. 0  The thought of harming myself has occurred to me. 0  Edinburgh Postnatal Depression Scale Total 3      After visit meds:  Allergies as of 11/09/2020       Reactions   Penicillins Other (See Comments)   Other Reaction: Not Assessed   Sulfa Antibiotics Other (See Comments)   Other Reaction: Not Assessed   Wellbutrin [bupropion]         Medication List     STOP taking these medications    ferrous sulfate 325 (65 FE) MG tablet   nicotine 7 mg/24hr patch Commonly known as: Nicoderm CQ   nitrofurantoin (macrocrystal-monohydrate) 100 MG capsule Commonly known as: MACROBID       TAKE these medications    CitraNatal Assure 35-1 & 300 MG tablet Take 2 tablets by mouth daily.         Discharge home  in stable condition Infant Feeding:  formula Infant Disposition:home with mother Discharge instruction: per After Visit Summary and Postpartum booklet. Activity: Advance as tolerated. Pelvic rest for 6 weeks.  Diet: routine diet Anticipated Birth Control:  planning interval tubal Postpartum Appointment: tubal planning visit and 6 weeks Additional Postpartum F/U:  none Future Appointments:No future appointments.  Follow up Visit:  Follow-up Information     Will Bonnet, MD. Schedule an appointment as soon as possible for a visit in 1 week(s).   Specialty: Obstetrics and Gynecology Why: 1-2 weeks tubal planning visit. Go to office in the next week to sign tubal consent form. Surgery will be no sooner than 30 days after consent is signed. Contact information: [address] [place] [address] [phone]                 SIGNED: Christean Leaf, CNM [facility] 11/09/2020, 11:57 AM

## 2020-11-08 NOTE — Progress Notes (Signed)
Mom requesting to go downstairs. RN advised that an adult would need to monitor baby if she steps off of floor.  RN removed IV.  Father of baby sitting in room with baby.

## 2020-11-08 NOTE — TOC Initial Note (Signed)
Transition of Care [facility]) - Initial/Assessment Note    Patient Details  Name: Cheryl Mccullough MRN: [id number] Date of Birth: [birth year]  [facility]) CM/SW Contact:    Hetty Ely, RN Phone Number: 11/08/2020, 2:27 PM  Clinical Narrative: Substance Abuse counseling done with patient, who says she only uses Marijuana last used two weeks ago, do not feel like it's an issue. MOB says she has four other kids in the home, ages 54,3,8 & 38, she has everything she needs for the baby. Has a DSS Caseworker and receiving WIC services. SA resource list given to patient. No other TOC needs, patient can discharge when medically stable.                        Patient Goals and CMS Choice        Expected Discharge Plan and Services                                                Prior Living Arrangements/Services                       Activities of Daily Living Home Assistive Devices/Equipment: None ADL Screening (condition at time of admission) Patient's cognitive ability adequate to safely complete daily activities?: Yes Is the patient deaf or have difficulty hearing?: No Does the patient have difficulty seeing, even when wearing glasses/contacts?: No Does the patient have difficulty concentrating, remembering, or making decisions?: No Patient able to express need for assistance with ADLs?: Yes Does the patient have difficulty dressing or bathing?: No Independently performs ADLs?: Yes (appropriate for developmental age) Does the patient have difficulty walking or climbing stairs?: No Weakness of Legs: None Weakness of Arms/Hands: None  Permission Sought/Granted                  Emotional Assessment              Admission diagnosis:  Normal labor [O80, Z37.9] Patient Active Problem List   Diagnosis Date Noted   Normal labor 11/08/2020   Limited prenatal care 11/08/2020   [redacted] weeks gestation of pregnancy 11/08/2020   Supervision of other  normal pregnancy, antepartum 06/12/2020   Tobacco use affecting pregnancy, antepartum 06/12/2020   PCP:  Department, [facility] Pharmacy:   [id number] [place], [place] - [address] AT [facility] [address] [place] [address] Phone: [phone] Fax: [phone]     Social Determinants of Health (SDOH) Interventions    Readmission Risk Interventions No flowsheet data found.

## 2020-11-08 NOTE — H&P (Signed)
OB History & Physical   History of Present Illness:  Chief Complaint: regular uterine contractions  HPI:  Cheryl Mccullough is a 35 y.o. Z6X0960 female at [redacted]w[redacted]d dated by a 20 week ultrasound.  Her pregnancy has been complicated by scant prenatal care .    She reports contractions.   She denies leakage of fluid.   She denies vaginal bleeding.   She reports fetal movement.    Total weight gain for pregnancy: 7.711 kg   Obstetrical Problem List: FIFTH Problems (from 06/12/20 to present)     Problem Noted Resolved   Encounter for supervision of other normal pregnancy, unspecified trimester 06/12/2020 by Zipporah Plants, CNM No   Overview Signed 06/12/2020 11:48 AM by Zipporah Plants, CNM     Nursing Staff Provider  Office Location  [facility] Dating   LMP (irregular, [place] ordered)  Language  English Anatomy US    Flu Vaccine   declined Genetic Screen  NIPS:   Did not get  TDaP vaccine    Hgb A1C or  GTT Early : n/a Third trimester :   Rhogam  N/a   LAB RESULTS   Feeding Plan  formula Blood Type   O+  Contraception  Antibody  negative  Circumcision  Rubella  Immune  Pediatrician   RPR   NR  Support Person  Chrissie Noa - boyfriend HBsAg   negative  Prenatal Classes  HIV  negative    Varicella  Immune  BTL Consent  GBS  (For PCN allergy, check sensitivities)        VBAC Consent  n/a Pap  NILM 2020    Hgb Electro   n/a    CF      SMA                   Maternal Medical History:   Past Medical History:  Diagnosis Date   Medical history non-contributory    Ovarian cyst     Past Surgical History:  Procedure Laterality Date   OVARIAN CYST REMOVAL Right     Allergies  Allergen Reactions   Penicillins Other (See Comments)    Other Reaction: Not Assessed   Sulfa Antibiotics Other (See Comments)    Other Reaction: Not Assessed   Wellbutrin [Bupropion]     Prior to Admission medications: declined    OB History  Gravida Para Term Preterm AB Living  5 4 4     4   SAB IAB Ectopic  Multiple Live Births        0 4    # Outcome Date GA Lbr Len/2nd Weight Sex Delivery Anes PTL Lv  5 Current           4 Term 02/10/19 [redacted]w[redacted]d  2940 g F Vag-Spont None  LIV  3 Term 12/08/17 [redacted]w[redacted]d 184:33 / 00:03 3480 g M Vag-Spont None  LIV  2 Term 05/05/12 [redacted]w[redacted]d  3204 g M Vag-Spont   LIV  1 Term 02/03/08 [redacted]w[redacted]d  3062 g M Vag-Spont   LIV    Prenatal care site: [facility], she had 2 prenatal visits  Social History: She  reports that she has been smoking cigarettes. She has been smoking an average of .5 packs per day. She has never used smokeless tobacco. She reports current drug use. Drug: Marijuana. She reports that she does not drink alcohol.  Family History: family history includes Breast cancer (age of onset: 8) in her mother.   Review of Systems  Constitutional: Negative.   HENT:  Negative.    Eyes: Negative.   Respiratory: Negative.    Cardiovascular: Negative.   Gastrointestinal:  Positive for abdominal pain (contractions). Negative for constipation, diarrhea, heartburn, melena, nausea and vomiting.  Genitourinary: Negative.   Musculoskeletal: Negative.   Skin: Negative.   Neurological: Negative.   Psychiatric/Behavioral: Negative.      Physical Exam:  BP (!) 141/63   Pulse 66   Temp 97.7 F (36.5 C) (Oral)   Resp 18   LMP  (Approximate)   Physical Exam Constitutional:      General: She is not in acute distress.    Appearance: Normal appearance. She is well-developed.  Genitourinary:     Genitourinary Comments: 9.5 cm and cephalic per RN  HENT:     Head: Normocephalic and atraumatic.  Eyes:     General: No scleral icterus.    Conjunctiva/sclera: Conjunctivae normal.  Cardiovascular:     Rate and Rhythm: Normal rate and regular rhythm.     Heart sounds: No murmur heard.   No friction rub. No gallop.  Pulmonary:     Effort: Pulmonary effort is normal. No respiratory distress.     Breath sounds: Normal breath sounds. No wheezing or rales.  Abdominal:      General: Bowel sounds are normal. There is no distension.     Palpations: Abdomen is soft. There is mass (gravid).     Tenderness: There is no abdominal tenderness. There is no guarding or rebound.  Musculoskeletal:        General: Normal range of motion.     Cervical back: Normal range of motion and neck supple.  Neurological:     General: No focal deficit present.     Mental Status: She is alert and oriented to person, place, and time.     Cranial Nerves: No cranial nerve deficit.  Skin:    General: Skin is warm and dry.     Findings: No erythema.  Psychiatric:        Mood and Affect: Mood normal.        Behavior: Behavior normal.        Judgment: Judgment normal.    Baseline FHR: 120 beats/min   Variability: moderate   Accelerations: present   Decelerations: absent Contractions: present frequency: 3 q 10 min Overall assessment: cat 1  COVID19: pending  Assessment:  Cheryl Mccullough is a 35 y.o. G71P4004 female at [redacted]w[redacted]d with normal labor, scant prenatal care.   Plan:  Admit to Labor & Delivery  CBC, T&S, Clrs, IVF GBS unknown. No treatment per CDC.   Fetwal well-being: reassuring UDS for scant prenatal care Expectant mangement.   Thomasene Mohair, MD 11/08/2020 6:13 AM

## 2020-11-09 LAB — CBC
HCT: 27.5 % — ABNORMAL LOW (ref 36.0–46.0)
Hemoglobin: 9.1 g/dL — ABNORMAL LOW (ref 12.0–15.0)
MCH: 28.6 pg (ref 26.0–34.0)
MCHC: 33.1 g/dL (ref 30.0–36.0)
MCV: 86.5 fL (ref 80.0–100.0)
Platelets: 238 10*3/uL (ref 150–400)
RBC: 3.18 MIL/uL — ABNORMAL LOW (ref 3.87–5.11)
RDW: 13.7 % (ref 11.5–15.5)
WBC: 8.8 10*3/uL (ref 4.0–10.5)
nRBC: 0 % (ref 0.0–0.2)

## 2020-11-09 NOTE — Progress Notes (Signed)
Subjective:  She is doing well postpartum day 1: tolerating regular diet, pain controlled with PO medication, ambulating and voiding without difficulty. She would like to discharge to home, however, due to unknown GBS status newborn will likely need to stay until tomorrow. She will stay if newborn is not discharged. She desires tubal ligation and knows that she will need to go to the office to sign tubal consent form and that surgery will be no sooner than 30 days after that.   Objective:  Vital signs in last 24 hours: Temp:  [97.5 F (36.4 C)-98.7 F (37.1 C)] 98.3 F (36.8 C) (09/08 0743) Pulse Rate:  [55-85] 60 (09/08 0743) Resp:  [18-20] 20 (09/08 0743) BP: (100-117)/(68-88) 112/88 (09/08 0743) SpO2:  [96 %-100 %] 99 % (09/08 0743)    General: NAD Pulmonary: no increased work of breathing Abdomen: non-distended, non-tender, fundus firm at level of umbilicus Extremities: no edema, no erythema, no tenderness  Results for orders placed or performed during the hospital encounter of 11/08/20 (from the past 72 hour(s))  Resp Panel by RT-PCR (Flu A&B, Covid) Nasopharyngeal Swab     Status: None   Collection Time: 11/08/20  6:03 AM   Specimen: Nasopharyngeal Swab; Nasopharyngeal(NP) swabs in vial transport medium  Result Value Ref Range   SARS Coronavirus 2 by RT PCR NEGATIVE NEGATIVE    Comment: (NOTE) SARS-CoV-2 target nucleic acids are NOT DETECTED.  The SARS-CoV-2 RNA is generally detectable in upper respiratory specimens during the acute phase of infection. The lowest concentration of SARS-CoV-2 viral copies this assay can detect is 138 copies/mL. A negative result does not preclude SARS-Cov-2 infection and should not be used as the sole basis for treatment or other patient management decisions. A negative result may occur with  improper specimen collection/handling, submission of specimen other than nasopharyngeal swab, presence of viral mutation(s) within the areas  targeted by this assay, and inadequate number of viral copies(<138 copies/mL). A negative result must be combined with clinical observations, patient history, and epidemiological information. The expected result is Negative.  Fact Sheet for Patients:  BloggerCourse.com  Fact Sheet for Healthcare Providers:  SeriousBroker.it  This test is no t yet approved or cleared by the [place] FDA and  has been authorized for detection and/or diagnosis of SARS-CoV-2 by FDA under an Emergency Use Authorization (EUA). This EUA will remain  in effect (meaning this test can be used) for the duration of the COVID-19 declaration under Section 564(b)(1) of the Act, 21 U.S.C.section 360bbb-3(b)(1), unless the authorization is terminated  or revoked sooner.       Influenza A by PCR NEGATIVE NEGATIVE   Influenza B by PCR NEGATIVE NEGATIVE    Comment: (NOTE) The Xpert Xpress SARS-CoV-2/FLU/RSV plus assay is intended as an aid in the diagnosis of influenza from Nasopharyngeal swab specimens and should not be used as a sole basis for treatment. Nasal washings and aspirates are unacceptable for Xpert Xpress SARS-CoV-2/FLU/RSV testing.  Fact Sheet for Patients: BloggerCourse.com  Fact Sheet for Healthcare Providers: SeriousBroker.it  This test is not yet approved or cleared by the [place] FDA and has been authorized for detection and/or diagnosis of SARS-CoV-2 by FDA under an Emergency Use Authorization (EUA). This EUA will remain in effect (meaning this test can be used) for the duration of the COVID-19 declaration under Section 564(b)(1) of the Act, 21 U.S.C. section 360bbb-3(b)(1), unless the authorization is terminated or revoked.  Performed at [facility], [address], [place], [place]  [address]   CBC     Status: Abnormal   Collection Time: 11/08/20  6:03 AM  Result  Value Ref Range   WBC 13.8 (H) 4.0 - 10.5 K/uL   RBC 3.57 (L) 3.87 - 5.11 MIL/uL   Hemoglobin 10.6 (L) 12.0 - 15.0 g/dL   HCT 16.9 (L) 45.0 - 38.8 %   MCV 86.3 80.0 - 100.0 fL   MCH 29.7 26.0 - 34.0 pg   MCHC 34.4 30.0 - 36.0 g/dL   RDW 82.8 00.3 - 49.1 %   Platelets 284 150 - 400 K/uL   nRBC 0.0 0.0 - 0.2 %    Comment: Performed at [facility], [address] [place], [place] [address]  Type and screen [facility]     Status: None   Collection Time: 11/08/20  6:03 AM  Result Value Ref Range   ABO/RH(D) O POS    Antibody Screen NEG    Sample Expiration      11/11/2020,2359 Performed at [facility], [address] [place], [place] [address]   RPR     Status: None   Collection Time: 11/08/20  6:03 AM  Result Value Ref Range   RPR Ser Ql NON REACTIVE NON REACTIVE    Comment: Performed at [facility], [address], [place], [place] [address]  Urine Drug Screen, Qualitative ([facility] only)     Status: Abnormal   Collection Time: 11/08/20 10:13 AM  Result Value Ref Range   Tricyclic, Ur Screen NONE DETECTED NONE DETECTED   Amphetamines, Ur Screen NONE DETECTED NONE DETECTED   MDMA (Ecstasy)Ur Screen NONE DETECTED NONE DETECTED   Cocaine Metabolite,Ur [place] NONE DETECTED NONE DETECTED   Opiate, Ur Screen NONE DETECTED NONE DETECTED   Phencyclidine (PCP) Ur S NONE DETECTED NONE DETECTED   Cannabinoid 50 Ng, Ur [place] POSITIVE (A) NONE DETECTED   Barbiturates, Ur Screen NONE DETECTED NONE DETECTED   Benzodiazepine, Ur Scrn NONE DETECTED NONE DETECTED   Methadone Scn, Ur NONE DETECTED NONE DETECTED    Comment: (NOTE) Tricyclics + metabolites, urine    Cutoff 1000 ng/mL Amphetamines + metabolites, urine  Cutoff 1000 ng/mL MDMA (Ecstasy), urine              Cutoff 500 ng/mL Cocaine Metabolite, urine          Cutoff 300 ng/mL Opiate + metabolites, urine        Cutoff 300 ng/mL Phencyclidine (PCP), urine         Cutoff 25 ng/mL Cannabinoid,  urine                 Cutoff 50 ng/mL Barbiturates + metabolites, urine  Cutoff 200 ng/mL Benzodiazepine, urine              Cutoff 200 ng/mL Methadone, urine                   Cutoff 300 ng/mL  The urine drug screen provides only a preliminary, unconfirmed analytical test result and should not be used for non-medical purposes. Clinical consideration and professional judgment should be applied to any positive drug screen result due to possible interfering substances. A more specific alternate chemical method must be used in order to obtain a confirmed analytical result. Gas chromatography / mass spectrometry (GC/MS) is the preferred confirm atory method. Performed at [facility], [address], [place], [place] [address]   CBC     Status: Abnormal   Collection  Time: 11/09/20  6:21 AM  Result Value Ref Range   WBC 8.8 4.0 - 10.5 K/uL   RBC 3.18 (L) 3.87 - 5.11 MIL/uL   Hemoglobin 9.1 (L) 12.0 - 15.0 g/dL   HCT 62.6 (L) 94.8 - 54.6 %   MCV 86.5 80.0 - 100.0 fL   MCH 28.6 26.0 - 34.0 pg   MCHC 33.1 30.0 - 36.0 g/dL   RDW 27.0 35.0 - 09.3 %   Platelets 238 150 - 400 K/uL   nRBC 0.0 0.0 - 0.2 %    Comment: Performed at [facility], [address], [place], [place] [address]    Assessment:   35 y.o. [id number] postpartum day # 1  Plan:    1) Acute blood loss anemia - hemodynamically stable and asymptomatic - po ferrous sulfate  2) Blood Type --/--/O POS (09/07 7893) / Rubella 3.37 (04/11 1117) / Varicella Immune  3) TDAP status  administer prior to discharge if patient desires  4) Feeding plan:  formula  5)  Education given regarding options for contraception, as well as compatibility with breast feeding if applicable.  Patient plans on interval tubal ligation for contraception.  6) Transition of care consult prior to discharge  7) Disposition: continue current care   Tresea Mall, CNM [facility] 11/09/2020, 9:39  AM

## 2020-11-09 NOTE — TOC Progression Note (Addendum)
Transition of Care [facility]) - Progression Note    Patient Details  Name: Cheryl Mccullough MRN: [id number] Date of Birth: 07-19-1985  Transition [facility] Care [facility]) CM/SW Contact  Hetty Ely, RN Phone Number: 11/09/2020, 4:01 PM  Clinical Narrative:  Spoke with MOB about baby positive urine drug screen for Marijuana. MOB informed that DSS report will be done per guidelines and I will need a phone number for follow up, MOB voices understanding DSS to call [phone] or [phone]. MOB agrees to be available and cooperative with caseworker.   4:10pm Received a return call for  DSS, patient lives in [place]. Will need to call there to make report. Called [phone], the after hours voice message was on, will call tomorrow to give report.         Expected Discharge Plan and Services           Expected Discharge Date: 11/09/20                                     Social Determinants of Health (SDOH) Interventions    Readmission Risk Interventions No flowsheet data found.

## 2020-11-09 NOTE — Progress Notes (Signed)
Mother discharged.  Discharge instructions given.  Mother verbalizes understanding.  Transported by auxiliary.  

## 2020-11-10 NOTE — TOC Transition Note (Signed)
Transition of Care [facility]) - CM/SW Discharge Note   Patient Details  Name: Margot Oriordan MRN: [id number] Date of Birth: 11/07/1985  Transition [facility] Care [facility]) CM/SW Contact:  Hetty Ely, RN Phone Number: 11/10/2020, 9:32 AM   Clinical Narrative:  [place] CPS, given positive urine drug screen report to CPSSW Hoyle Barr per guidelines.           Patient Goals and CMS Choice        Discharge Placement                       Discharge Plan and Services                                     Social Determinants of Health (SDOH) Interventions     Readmission Risk Interventions No flowsheet data found.

## 2020-11-14 ENCOUNTER — Ambulatory Visit: Payer: Medicaid Other | Admitting: Obstetrics and Gynecology

## 2021-06-20 ENCOUNTER — Ambulatory Visit: Payer: Medicaid Other | Admitting: Licensed Practical Nurse

## 2021-07-11 ENCOUNTER — Encounter: Payer: Self-pay | Admitting: Obstetrics

## 2021-07-11 ENCOUNTER — Ambulatory Visit (INDEPENDENT_AMBULATORY_CARE_PROVIDER_SITE_OTHER): Payer: Medicaid Other | Admitting: Obstetrics

## 2021-07-11 ENCOUNTER — Telehealth: Payer: Self-pay | Admitting: Obstetrics

## 2021-07-11 ENCOUNTER — Other Ambulatory Visit (HOSPITAL_COMMUNITY)
Admission: RE | Admit: 2021-07-11 | Discharge: 2021-07-11 | Disposition: A | Discharge status: Home / Self Care | Payer: Medicaid Other | Source: Ambulatory Visit | Attending: Obstetrics | Admitting: Obstetrics

## 2021-07-11 VITALS — BP 120/60 | Ht 62.0 in | Wt 131.0 lb

## 2021-07-11 DIAGNOSIS — Z803 Family history of malignant neoplasm of breast: Secondary | ICD-10-CM

## 2021-07-11 DIAGNOSIS — Z124 Encounter for screening for malignant neoplasm of cervix: Secondary | ICD-10-CM | POA: Insufficient documentation

## 2021-07-11 DIAGNOSIS — N920 Excessive and frequent menstruation with regular cycle: Secondary | ICD-10-CM | POA: Insufficient documentation

## 2021-07-11 DIAGNOSIS — Z1231 Encounter for screening mammogram for malignant neoplasm of breast: Secondary | ICD-10-CM

## 2021-07-11 DIAGNOSIS — Z113 Encounter for screening for infections with a predominantly sexual mode of transmission: Secondary | ICD-10-CM | POA: Insufficient documentation

## 2021-07-11 DIAGNOSIS — Z01411 Encounter for gynecological examination (general) (routine) with abnormal findings: Secondary | ICD-10-CM

## 2021-07-11 DIAGNOSIS — Z862 Personal history of diseases of the blood and blood-forming organs and certain disorders involving the immune mechanism: Secondary | ICD-10-CM

## 2021-07-11 DIAGNOSIS — Z8041 Family history of malignant neoplasm of ovary: Secondary | ICD-10-CM

## 2021-07-11 DIAGNOSIS — F17200 Nicotine dependence, unspecified, uncomplicated: Secondary | ICD-10-CM

## 2021-07-11 NOTE — Patient Instructions (Signed)
Have a great year! Please call with any concerns. ?Don't forget to wear your seatbelt everyday! ?If you are not signed up on MyChart, please ask [place] how to sign up for it!  ? ?In a world where you can be anything, please be kind.  ? ?There is no height or weight on file to calculate BMI. [email or web address] [id number] ?A Healthy Lifestyle: Care Instructions ?Your Care Instructions ? ?A healthy lifestyle can help you feel good, stay at a healthy weight, and have plenty of energy for both work and play. A healthy lifestyle is something you can share with your whole family. ?A healthy lifestyle also can lower your risk for serious health problems, such as high blood pressure, heart disease, and diabetes. ?You can follow a few steps listed below to improve your health and the health of your family. ?Follow-up care is a key part of your treatment and safety. Be sure to make and go to all appointments, and call your doctor if you are having problems. It's also a good idea to know your test results and keep a list of the medicines you take. ?How can you care for yourself at home? ?Do not eat too much sugar, fat, or fast foods. You can still have dessert and treats now and then. The goal is moderation. ?Start small to improve your eating habits. Pay attention to portion sizes, drink less juice and soda pop, and eat more fruits and vegetables. ?Eat a healthy amount of food. A 3-ounce serving of meat, for example, is about the size of a deck of cards. Fill the rest of your plate with vegetables and whole grains. ?Limit the amount of soda and sports drinks you have every day. Drink more water when you are thirsty. ?Eat at least 5 servings of fruits and vegetables every day. It may seem like a lot, but it is not hard to reach this goal. A serving or helping is 1 piece of fruit, 1 cup of vegetables, or 2 cups of leafy, raw vegetables. Have an apple or some carrot sticks as an afternoon snack instead of a candy bar. Try to  have fruits and/or vegetables at every meal. ?Make exercise part of your daily routine. You may want to start with simple activities, such as walking, bicycling, or slow swimming. Try to be active 30 to 60 minutes every day. You do not need to do all 30 to 60 minutes all at once. For example, you can exercise 3 times a day for 10 or 20 minutes. Moderate exercise is safe for most people, but it is always a good idea to talk to your doctor before starting an exercise program. ?Keep moving. Mow the lawn, work in the garden, or ([id number],)@. Take the stairs instead of the elevator at work. ?If you smoke, quit. People who smoke have an increased risk for heart attack, stroke, cancer, and other lung illnesses. Quitting is hard, but there are ways to boost your chance of quitting tobacco for good. ?Use nicotine gum, patches, or lozenges. ?Ask your doctor about stop-smoking programs and medicines. ?Keep trying. ?In addition to reducing your risk of diseases in the future, you will notice some benefits soon after you stop using tobacco. If you have shortness of breath or asthma symptoms, they will likely get better within a few weeks after you quit. ?Limit how much alcohol you drink. Moderate amounts of alcohol (up to 2 drinks a day for men, 1 drink a day for women) are okay. But drinking  too much can lead to liver problems, high blood pressure, and other health problems. ?Family health ?If you have a family, there are many things you can do together to improve your health. ?Eat meals together as a family as often as possible. ?Eat healthy foods. This includes fruits, vegetables, lean meats and dairy, and whole grains. ?Include your family in your fitness plan. Most people think of activities such as jogging or tennis as the way to fitness, but there are many ways you and your family can be more active. Anything that makes you breathe hard and gets your heart pumping is exercise. Here are some tips: ?Walk to do errands  or to take your child to school or the bus. ?Go for a family bike ride after dinner instead of watching TV. ?Care instructions adapted under license by your healthcare professional. This care instruction is for use with your licensed healthcare professional. If you have questions about a medical condition or this instruction, always ask your healthcare professional. [employer] disclaims any warranty or liability for your use of this information.  ? ?

## 2021-07-11 NOTE — Addendum Note (Signed)
Addended by: Althea Charon on: 07/11/2021 03:03 PM ? ? Modules accepted: Orders ? ?

## 2021-07-11 NOTE — Progress Notes (Signed)
Chief Complaint  ?Patient presents with  ? Gynecologic Exam  ? ?Patient Cheryl Mccullough is an 36 y.o. year old [place] Patient's last menstrual period was 06/30/2021 (exact date). currently condoms for contraception who presents for annual. Patient is exercising regularly. Patient does perform self breast exam. Patient reports family history of genetic cancer.  MGM with ovarian cancer in 45s, mother with breast cancer in 70s.   ?Patient takes no multivitamin. Patient is considering permanent birth control method but would like to discuss options for heavy bleeding. Patient is  sexually active with no problems.   ?Patients pronouns are she/her/hers  ?Patient with history of anemia.  ? ?Primary care provider: [facility], [facility] ? ?Review of Systems  ?Constitutional:  Positive for fatigue.  ?Genitourinary:  Positive for vaginal bleeding.   ? ?Menstrual history: ?Menarche: 12 ?Period Cycle (Days): 28 ?Period Duration (Days): 8 days ?Period Pattern: Regular ?Menstrual Flow: Heavy ?Menstrual Control: Tampon, Maxi pad ?Patient reports period have been having heavier periods since having child in 11/2020. Patient states cycles are not normally painful but had bad cramping over the last two cycles. She has had soiling episodes as well.  ?Recent glucocorticoid use: no ?Patient denies vision changes, bleeding gums, or nose bleeds. Patient denies easy bleeding or bruising. There is no family history of blood disorders.  ?Patient reports night sweat vasomotor symptoms , denies vaginal dryness.  ?Patient smokes 1/2 ppd. ? ?Past Medical History:  ?Diagnosis Date  ? Medical history non-contributory   ? Ovarian cyst   ? ?Past Surgical History:  ?Procedure Laterality Date  ? OVARIAN CYST REMOVAL Right   ? ?Family History  ?Problem Relation Age of Onset  ? Breast cancer Mother 29  ? ?Social History  ? ?Socioeconomic History  ? Marital status: Single  ?  Spouse name: Gwyndolyn Saxon  ? Number of children: Not on file  ? Years of  education: Not on file  ? Highest education level: Not on file  ?Occupational History  ? Not on file  ?Tobacco Use  ? Smoking status: Some Days  ?  Packs/day: 0.50  ?  Types: Cigarettes  ? Smokeless tobacco: Never  ?Vaping Use  ? Vaping Use: Never used  ?Substance and Sexual Activity  ? Alcohol use: Never  ? Drug use: Yes  ?  Types: Marijuana  ? Sexual activity: Yes  ?  Partners: Male  ?  Birth control/protection: None  ?  Comment: possible Vasectomy  ?Other Topics Concern  ? Not on file  ?Social History Narrative  ? Not on file  ? ?Social Determinants of Health  ? ?Financial Resource Strain: Not on file  ?Food Insecurity: Not on file  ?Transportation Needs: Not on file  ?Physical Activity: Not on file  ?Stress: Not on file  ?Social Connections: Not on file  ?Intimate Partner Violence: Not on file  ? ?Patient denies abnormal paps. Patient denies pelvic infections. Patient denies domestic violence or sexual abuse ?Patient has not received Gardisil series ? ?Health Maintenance  ?Topic Date Due  ? COVID-19 Vaccine (1) Never done  ? Hepatitis C Screening  Never done  ? TETANUS/TDAP  Never done  ? INFLUENZA VACCINE  10/02/2021  ? PAP SMEAR-Modifier  11/17/2021  ? HIV Screening  Completed  ? HPV VACCINES  Aged Out  ? ?Medicine list and allergies reviewed and updated.   ?  ?Objective:  ?BP 120/60   Ht 5' 2"  (1.575 m)   Wt 131 lb (59.4 kg)   LMP 06/30/2021 (Exact  Date)   BMI 23.96 kg/m?  ?[place] [facility] Visit from 07/11/2021 in [facility]  ?PHQ-2 Total Score 0  ? ?  ? ? ?Physical Exam ?Vitals and nursing note reviewed. Exam conducted with a chaperone present.  ?Constitutional:   ?   General: She is not in acute distress. ?   Appearance: Normal appearance. She is not ill-appearing.  ?HENT:  ?   Head: Normocephalic and atraumatic.  ?   Mouth/Throat:  ?   Mouth: Mucous membranes are moist.  ?   Pharynx: No oropharyngeal exudate.  ?   Comments: Poor dentition ?Eyes:  ?   Extraocular Movements: Extraocular  movements intact.  ?Neck:  ?   Thyroid: No thyromegaly.  ?Cardiovascular:  ?   Rate and Rhythm: Normal rate and regular rhythm.  ?   Pulses: Normal pulses.  ?   Heart sounds: Normal heart sounds.  ?Pulmonary:  ?   Effort: Pulmonary effort is normal. No respiratory distress.  ?   Breath sounds: Normal breath sounds.  ?Chest:  ?   Chest wall: No mass or tenderness.  ?Breasts: ?   Breasts are symmetrical.  ?   Right: Normal. No mass, nipple discharge, skin change or tenderness.  ?   Left: Normal. No mass, nipple discharge, skin change or tenderness.  ?Abdominal:  ?   General: There is no distension.  ?   Palpations: There is no mass.  ?   Tenderness: There is no abdominal tenderness. There is no rebound.  ?   Hernia: No hernia is present.  ?Genitourinary: ?   General: Normal vulva.  ?   Exam position: Lithotomy position.  ?   Pubic Area: No rash.   ?   Labia:     ?   Right: No rash, tenderness or lesion.     ?   Left: No rash, tenderness or lesion.   ?   Urethra: No urethral lesion.  ?   Vagina: Normal. No foreign body. No vaginal discharge, tenderness or lesions.  ?   Cervix: No discharge, friability, lesion, erythema or cervical bleeding.  ?   Uterus: Normal. Not enlarged, not tender and no uterine prolapse.   ?   Adnexa: Right adnexa normal and left adnexa normal.    ?   Right: No tenderness or fullness.      ?   Left: No tenderness or fullness.    ?Musculoskeletal:     ?   General: No tenderness. Normal range of motion.  ?   Cervical back: Normal range of motion. No tenderness.  ?   Right lower leg: No edema.  ?   Left lower leg: No edema.  ?Lymphadenopathy:  ?   Cervical: No cervical adenopathy.  ?   Upper Body:  ?   Right upper body: No axillary adenopathy.  ?   Left upper body: No axillary adenopathy.  ?   Lower Body: No right inguinal adenopathy. No left inguinal adenopathy.  ?Skin: ?   General: Skin is warm and dry.  ?Neurological:  ?   General: No focal deficit present.  ?   Mental Status: She is alert and  oriented to person, place, and time. Mental status is at baseline.  ?   Coordination: Coordination normal.  ?   Gait: Gait normal.  ?   Deep Tendon Reflexes: Reflexes normal.  ?Psychiatric:     ?   Mood and Affect: Mood normal.     ?   Behavior: Behavior  normal.     ?   Thought Content: Thought content normal.     ?   Judgment: Judgment normal.  ?  ?Assessment/Plan:  ? ?Encounter for gynecological examination with abnormal finding - Plan: CBC with Differential, Comp Met (CMET), TSH Rfx on Abnormal to Free T4, HgB A1c, Prolactin ? ?Family history of breast cancer in mother - Plan: MM Digital Screening ? ?Family history of ovarian cancer ? ?Menorrhagia with regular cycle - Plan: CBC with Differential, TSH Rfx on Abnormal to Free T4, Prolactin, POCT urine pregnancy, US Transvaginal Non-OB, Cervicovaginal ancillary only, B-HCG Quant ? ?Routine screening for STI (sexually transmitted infection) - Plan: Cervicovaginal ancillary only ? ?Encounter for screening mammogram for malignant neoplasm of breast - Plan: MM Digital Screening ? ?History of anemia - Plan: CBC with Differential ? ?Smoker ? ?Screening for malignant neoplasm of cervix - Plan: Cytology - PAP ? ?Follow up Pap HPV today ?Cultures obtained for heavy bleeding and screen ?Monthly self breast exam. Mammo is ordered ?Recommended genetic cancer screening given significant family history of cancer, patient will consider. Brochure given.  ?Pt with heavy bleeding and history of anemia. Did not tolerate iron well in the past and may need hematology referral ?Daily multivitamin recommended. ?Contraception: see below ?Gardisil discussed and declined  ?Wellness labs + labs for AUB obtained.   ?Methods of treating abnormal bleeding were reviewed in detail. We discussed medical options including:  ?Contraceptives: combo oral contraceptive pills Nuvaring/Annovera, Nexplanon, Skyla/Mirena/Kyleena/Liletta, DepoProvera.  ?Progesterone only options: Provera/Prometrium,  Aygestin, minipill ?Trial of NSAIDs ?Lysteda ?We also discussed surgical options of: ?-Hysteroscopy with resection/ dilation and curettage ?-Endometrial ablation- would recommend pretreatment of lining prior.  ?-Hyste

## 2021-07-11 NOTE — Progress Notes (Signed)
Last Pap: 11/2018  ? ?G5P5 ?Questions about the heavy bleeding ?

## 2021-07-11 NOTE — Telephone Encounter (Signed)
Pt is scheduled on 08-01-2021 with Dr.Crawford for Mirena IUD insert. ?

## 2021-07-12 ENCOUNTER — Telehealth: Payer: Self-pay

## 2021-07-12 LAB — COMPREHENSIVE METABOLIC PANEL
ALT: 14 IU/L (ref 0–32)
AST: 17 IU/L (ref 0–40)
Albumin/Globulin Ratio: 2.6 — ABNORMAL HIGH (ref 1.2–2.2)
Albumin: 4.4 g/dL (ref 3.8–4.8)
Alkaline Phosphatase: 76 IU/L (ref 44–121)
BUN/Creatinine Ratio: 8 — ABNORMAL LOW (ref 9–23)
BUN: 5 mg/dL — ABNORMAL LOW (ref 6–20)
Bilirubin Total: 0.2 mg/dL (ref 0.0–1.2)
CO2: 22 mmol/L (ref 20–29)
Calcium: 8.4 mg/dL — ABNORMAL LOW (ref 8.7–10.2)
Chloride: 105 mmol/L (ref 96–106)
Creatinine, Ser: 0.61 mg/dL (ref 0.57–1.00)
Globulin, Total: 1.7 g/dL (ref 1.5–4.5)
Glucose: 80 mg/dL (ref 70–99)
Potassium: 4 mmol/L (ref 3.5–5.2)
Sodium: 138 mmol/L (ref 134–144)
Total Protein: 6.1 g/dL (ref 6.0–8.5)
eGFR: 120 mL/min/{1.73_m2} (ref >59)

## 2021-07-12 LAB — CBC WITH DIFFERENTIAL/PLATELET
Basophils Absolute: 0.1 10*3/uL (ref 0.0–0.2)
Basos: 1 %
EOS (ABSOLUTE): 0.2 10*3/uL (ref 0.0–0.4)
Eos: 2 %
Hematocrit: 31 % — ABNORMAL LOW (ref 34.0–46.6)
Hemoglobin: 9.9 g/dL — ABNORMAL LOW (ref 11.1–15.9)
Immature Grans (Abs): 0 10*3/uL (ref 0.0–0.1)
Immature Granulocytes: 0 %
Lymphocytes Absolute: 2.3 10*3/uL (ref 0.7–3.1)
Lymphs: 29 %
MCH: 25.8 pg — ABNORMAL LOW (ref 26.6–33.0)
MCHC: 31.9 g/dL (ref 31.5–35.7)
MCV: 81 fL (ref 79–97)
Monocytes Absolute: 0.5 10*3/uL (ref 0.1–0.9)
Monocytes: 6 %
Neutrophils Absolute: 5 10*3/uL (ref 1.4–7.0)
Neutrophils: 62 %
Platelets: 307 10*3/uL (ref 150–450)
RBC: 3.84 x10E6/uL (ref 3.77–5.28)
RDW: 15.1 % (ref 11.7–15.4)
WBC: 8.1 10*3/uL (ref 3.4–10.8)

## 2021-07-12 LAB — BETA HCG QUANT (REF LAB): hCG Quant: <1 m[IU]/mL

## 2021-07-12 LAB — HEMOGLOBIN A1C
Est. average glucose Bld gHb Est-mCnc: 111 mg/dL
Hgb A1c MFr Bld: 5.5 % (ref 4.8–5.6)

## 2021-07-12 LAB — PROLACTIN: Prolactin: 7 ng/mL (ref 4.8–23.3)

## 2021-07-12 NOTE — Telephone Encounter (Signed)
Noted  

## 2021-07-12 NOTE — Telephone Encounter (Signed)
Noted. Will order to arrive by apt date/time. 

## 2021-07-12 NOTE — Telephone Encounter (Signed)
-----   Message from Horald Pollen, MD sent at 07/12/2021  9:44 AM EDT ----- ?Please inform patient that her labs are consistent with anemia and low serum calcium. I recommend follow up labs but she will need a primary doctor moving forward for management. I also recommend referral to hematology. Does she want [place] to order labs and referral? ?[facility]  ?

## 2021-07-12 NOTE — Telephone Encounter (Signed)
Called left voicemail for Cheryl Mccullough to give [place] a call back to go over results and ask her a few questions.  ?

## 2021-07-13 LAB — CERVICOVAGINAL ANCILLARY ONLY
Bacterial Vaginitis (gardnerella): POSITIVE — AB
Candida Glabrata: NEGATIVE
Candida Vaginitis: NEGATIVE
Chlamydia: NEGATIVE
Comment: NEGATIVE
Comment: NEGATIVE
Comment: NEGATIVE
Comment: NEGATIVE
Comment: NEGATIVE
Comment: NORMAL
Neisseria Gonorrhea: NEGATIVE
Trichomonas: NEGATIVE

## 2021-07-17 LAB — CYTOLOGY - PAP
Comment: NEGATIVE
Diagnosis: UNDETERMINED — AB
High risk HPV: NEGATIVE

## 2021-07-23 ENCOUNTER — Encounter: Payer: Self-pay | Admitting: Obstetrics and Gynecology

## 2021-07-24 ENCOUNTER — Telehealth: Payer: Self-pay

## 2021-07-24 NOTE — Telephone Encounter (Signed)
Network difficulties; try call again later.

## 2021-07-26 ENCOUNTER — Ambulatory Visit (INDEPENDENT_AMBULATORY_CARE_PROVIDER_SITE_OTHER): Payer: Medicaid Other

## 2021-07-26 DIAGNOSIS — N939 Abnormal uterine and vaginal bleeding, unspecified: Secondary | ICD-10-CM

## 2021-07-26 DIAGNOSIS — N920 Excessive and frequent menstruation with regular cycle: Secondary | ICD-10-CM

## 2021-07-27 NOTE — Telephone Encounter (Signed)
Second try to contact pt; same msg 'Network difficulties'.

## 2021-07-31 NOTE — Progress Notes (Signed)
Tried again to contact pt by phone. Still get the same recording "network diff; try call again later".  Pt has appt tomorrow c [facility].  Msg closed.

## 2021-08-01 ENCOUNTER — Encounter: Payer: Self-pay | Admitting: Obstetrics

## 2021-08-01 ENCOUNTER — Ambulatory Visit (INDEPENDENT_AMBULATORY_CARE_PROVIDER_SITE_OTHER): Payer: Medicaid Other | Admitting: Obstetrics

## 2021-08-01 VITALS — BP 100/60 | Wt 130.6 lb

## 2021-08-01 DIAGNOSIS — D500 Iron deficiency anemia secondary to blood loss (chronic): Secondary | ICD-10-CM

## 2021-08-01 DIAGNOSIS — Z30430 Encounter for insertion of intrauterine contraceptive device: Secondary | ICD-10-CM

## 2021-08-01 DIAGNOSIS — R7989 Other specified abnormal findings of blood chemistry: Secondary | ICD-10-CM | POA: Diagnosis not present

## 2021-08-01 DIAGNOSIS — N920 Excessive and frequent menstruation with regular cycle: Secondary | ICD-10-CM | POA: Diagnosis not present

## 2021-08-01 DIAGNOSIS — R87610 Atypical squamous cells of undetermined significance on cytologic smear of cervix (ASC-US): Secondary | ICD-10-CM

## 2021-08-01 MED ORDER — LEVONORGESTREL 20 MCG/DAY IU IUD
1.0000 | INTRAUTERINE_SYSTEM | Freq: Once | INTRAUTERINE | Status: AC
  Administered 2021-08-01: 1 via INTRAUTERINE

## 2021-08-01 NOTE — Patient Instructions (Addendum)
JMCMIRENAINX Expect irregular bleeding the first 2-3 months after the Mirena insertion. Eventually, your period may stop completely. Call for severe pain, temperature above 100.4, or foul smelling discharge. The Mirena does not protect you from STIs, so use condoms to protect yourself if necessary. Recommend pelvic rest for 72 hours.   levonorgestrel intrauterine system Pronunciation:  LEE voe nor JES trel IN tra UE ter ine SIS tem Brand:  Wyvonna Plum, Christean Grief What is the most important information I should know about levonorgestrel intrauterine system? You should not use this device if you have abnormal vaginal bleeding, a pelvic infection, certain other problems with your uterus or cervix, or if you have breast or uterine cancer, liver disease or liver tumor, or a weak immune system. Do not use during pregnancy. Call your doctor if you think you might be pregnant. What is levonorgestrel intrauterine system? Levonorgestrel is a female hormone that can cause changes in your cervix and uterus. Levonorgestrel intrauterine system is a T-shaped plastic intrauterine device (IUD) that is placed in the uterus where it slowly releases the hormone. Levonorgestrel intrauterine system used to prevent pregnancy for 3 to 6 years. You may use this IUD whether you have children or not. Mirena is also used to treat heavy menstrual bleeding in women who choose to use an intrauterine form of birth control. Levonorgestrel is a progestin and does not contain estrogen.  Levonorgestrel intrauterine system should not be used as emergency birth control. Levonorgestrel intrauterine system may also be used for purposes not listed in this medication guide. What should I discuss with my healthcare provider before using levonorgestrel intrauterine system? An IUD can increase your risk of developing a serious pelvic infection, which may threaten your life or your future ability to have children. Ask your doctor  about your personal risk. Do not use during pregnancy. This IUD can cause severe infection, miscarriage, premature birth, or death of the mother if left in place during pregnancy. Tell your doctor right away if you become pregnant.  If you continue a pregnancy that occurs while using this IUD, watch for signs such as fever, chills, cramps, vaginal bleeding or discharge. You should not use this device if you are allergic to levonorgestrel, silicone, silica, silver, barium, iron oxide, or polyethylene, or if you have: abnormal vaginal bleeding that has not been checked by a doctor; an untreated or uncontrolled pelvic infection (vaginal, cervical, uterine); endometriosis or a serious pelvic infection following a pregnancy or abortion in the past 3 months; pelvic inflammatory disease (PID), unless you had a normal pregnancy after the infection was treated and cleared; uterine fibroid tumors or conditions that affect the shape of the uterus; past or present cancer of the breast, cervix, or uterus; liver disease or liver tumor (benign or malignant); a condition that weakens your immune system, such as AIDS, leukemia, or IV drug abuse; or if you have another intrauterine device (IUD) in place. Tell your doctor if you have ever had: high blood pressure, heart problems, a heart valve disorder, a heart attack or stroke; bleeding problems; migraine headaches; or a vaginal infection, pelvic infection, or sexually transmitted disease. You should not use this IUD if you are breastfeeding a baby younger than 1 weeks old. This IUD may be more likely to form a hole or get embedded in the wall of your uterus if you have the device inserted while you are breastfeeding. How is levonorgestrel intrauterine system used? The levonorgestrel IUD is inserted through the vagina and placed into  the uterus by a doctor. The device is usually inserted within 7 days after the start of a menstrual period. You may feel pain or  dizziness during insertion of the IUD. You may also have minor vaginal bleeding. Tell your doctor if you still have these symptoms longer than 30 minutes. The levonorgestrel device should not interfere with sexual intercourse, wearing tampons, or using other vaginal medications. Your doctor will need to see you within a few weeks after insertion of the device to make sure it is still in place correctly. You will also need regular annual pelvic exams and Pap smears. You may have irregular periods during the first 3 to 6 months of use. Your flow may be lighter or heavier, and you may eventually stop having periods after several months. Tell your doctor if you do not have a period for 6 weeks or if you think you might be pregnant. The IUD may come out by itself. After each menstrual period, make sure you can still feel the removal strings. Wash your hands with soap and water, and insert your clean fingers into the vagina. You should be able to feel the strings at the opening of your cervix. Call your doctor at once if you cannot feel the strings, or if you think the IUD has slipped lower or has come out of your uterus, especially if you also have pain or bleeding. Use a non-hormone method of birth control (condom, diaphragm, cervical cap, or contraceptive sponge) to prevent pregnancy until your doctor is able to replace the IUD. If you need to have an MRI (magnetic resonance imaging), tell your caregivers ahead of time that you have an IUD in place. Your device may be removed at any time you decide to stop using birth control. The Mirena IUD must be removed at the end of the 6-year wearing time. Rutha BouchardKyleena must be removed after 5 years, and Skyla or Penni BombardLiletta must be removed after 3 years. Your doctor can insert a new device at that time if you wish to continue using this form of birth control. Only your doctor should remove the IUD. Do not attempt to remove the device yourself. If you wish to continue preventing  pregnancy, you may need to start using another birth control method a week before your levonorgestrel intrauterine system is removed. What happens if I miss a dose? Since the IUD continuously releases a low dose of levonorgestrel, missing a dose does not occur when using this form of levonorgestrel. What happens if I overdose? An overdose of levonorgestrel released from the intrauterine system is very unlikely to occur. What should I avoid while using levonorgestrel intrauterine system? Avoid having more than one sex partner. The IUD can increase your risk of developing a serious pelvic infection, which is often caused by sexually transmitted disease. Levonorgestrel intrauterine system will not protect you from sexually transmitted diseases, including HIV and AIDS. Using a condom is the only way to help protect yourself from these diseases. Call your doctor if your sex partner develops HIV or a sexually transmitted disease, or if you have any change in sexual relationships. What are the possible side effects of levonorgestrel intrauterine system? Get emergency medical help if you have signs of an allergic reaction: hives; difficult breathing; swelling of your face, lips, tongue, or throat. Get emergency medical help if you have severe pain in your lower stomach or side. This could be a sign of a tubal pregnancy (a pregnancy that implants in the fallopian tube instead  of the uterus). A tubal pregnancy is a medical emergency. The levonorgestrel IUD may become embedded into the wall of the uterus, or may perforate (form a hole) in the uterus.  If this occurs, the device may no longer prevent pregnancy, or it may move outside the uterus and cause scarring, infection, or damage to other organs. Your doctor may need to surgically remove the device. Call your doctor at once if you have: severe cramps or pelvic pain, pain during sexual intercourse; extreme dizziness or light-headed feeling; severe migraine  headache; heavy or ongoing vaginal bleeding, vaginal sores, vaginal discharge that is watery, foul-smelling discharge, or otherwise unusual; pale skin, weakness, easy bruising or bleeding, fever, chills, or other signs of infection; jaundice (yellowing of the skin or eyes); or sudden numbness or weakness (especially on one side of the body), confusion, problems with vision, sensitivity to light. Common side effects may include: pelvic pain, vaginal itching or infection, missed or irregular menstrual periods, changes in bleeding patterns or flow (especially during the first 3 to 6 months); temporary pain, bleeding, or dizziness during insertion of the IUD; ovarian cysts (pelvic pain that disappears within 3 months); stomach pain, nausea, vomiting, bloating; headache, migraine, depression, mood changes; back pain, breast tenderness or pain; weight gain, acne, changes in hair growth, loss of interest in sex; or puffiness in your face, hands, ankles, or feet. This is not a complete list of side effects and others may occur. Call your doctor for medical advice about side effects. You may report side effects to FDA at 1-800-FDA-1088. What other drugs will affect levonorgestrel intrauterine system? Some drugs can affect your blood levels of levonorgestrel, which could make this form of birth control less effective. Tell your doctor about all your other medicines, including prescription and over-the-counter medicines, vitamins, and herbal products. Tell your doctor about all your current medicines and any medicine you start or stop using. Where can I get more information? Your doctor or pharmacist can provide more information about the levonorgestrel intrauterine system. Remember, keep this and all other medicines out of the reach of children, never share your medicines with others, and use this medication only for the indication prescribed.    Have a great year! Please call with any concerns. Don't  forget to wear your seatbelt everyday! If you are not signed up on MyChart, please ask [place] how to sign up for it!   In a world where you can be anything, please be kind.   There is no height or weight on file to calculate BMI.  A Healthy Lifestyle: Care Instructions Your Care Instructions  A healthy lifestyle can help you feel good, stay at a healthy weight, and have plenty of energy for both work and play. A healthy lifestyle is something you can share with your whole family. A healthy lifestyle also can lower your risk for serious health problems, such as high blood pressure, heart disease, and diabetes. You can follow a few steps listed below to improve your health and the health of your family. Follow-up care is a key part of your treatment and safety. Be sure to make and go to all appointments, and call your doctor if you are having problems. It's also a good idea to know your test results and keep a list of the medicines you take. How can you care for yourself at home? Do not eat too much sugar, fat, or fast foods. You can still have dessert and treats now and then. The goal is  moderation. Start small to improve your eating habits. Pay attention to portion sizes, drink less juice and soda pop, and eat more fruits and vegetables. Eat a healthy amount of food. A 3-ounce serving of meat, for example, is about the size of a deck of cards. Fill the rest of your plate with vegetables and whole grains. Limit the amount of soda and sports drinks you have every day. Drink more water when you are thirsty. Eat at least 5 servings of fruits and vegetables every day. It may seem like a lot, but it is not hard to reach this goal. A serving or helping is 1 piece of fruit, 1 cup of vegetables, or 2 cups of leafy, raw vegetables. Have an apple or some carrot sticks as an afternoon snack instead of a candy bar. Try to have fruits and/or vegetables at every meal. Make exercise part of your daily routine. You  may want to start with simple activities, such as walking, bicycling, or slow swimming. Try to be active 30 to 60 minutes every day. You do not need to do all 30 to 60 minutes all at once. For example, you can exercise 3 times a day for 10 or 20 minutes. Moderate exercise is safe for most people, but it is always a good idea to talk to your doctor before starting an exercise program. Keep moving. Mow the lawn, work in the garden, or [employer]. Take the stairs instead of the elevator at work. If you smoke, quit. People who smoke have an increased risk for heart attack, stroke, cancer, and other lung illnesses. Quitting is hard, but there are ways to boost your chance of quitting tobacco for good. Use nicotine gum, patches, or lozenges. Ask your doctor about stop-smoking programs and medicines. Keep trying. In addition to reducing your risk of diseases in the future, you will notice some benefits soon after you stop using tobacco. If you have shortness of breath or asthma symptoms, they will likely get better within a few weeks after you quit. Limit how much alcohol you drink. Moderate amounts of alcohol (up to 2 drinks a day for men, 1 drink a day for women) are okay. But drinking too much can lead to liver problems, high blood pressure, and other health problems. Family health If you have a family, there are many things you can do together to improve your health. Eat meals together as a family as often as possible. Eat healthy foods. This includes fruits, vegetables, lean meats and dairy, and whole grains. Include your family in your fitness plan. Most people think of activities such as jogging or tennis as the way to fitness, but there are many ways you and your family can be more active. Anything that makes you breathe hard and gets your heart pumping is exercise. Here are some tips: Walk to do errands or to take your child to school or the bus. Go for a family bike ride after dinner instead  of watching TV. Care instructions adapted under license by your healthcare professional. This care instruction is for use with your licensed healthcare professional. If you have questions about a medical condition or this instruction, always ask your healthcare professional. [employer] disclaims any warranty or liability for your use of this information.

## 2021-08-01 NOTE — Progress Notes (Signed)
Chief Complaint  Patient presents with   Follow-up   Patient Cheryl Mccullough is an 36 y.o. year old [id number] Patient's last menstrual period was 06/30/2021 (exact date). currently condoms for contraception who presents for follow up. She has decided to get mirena IUD.   ULTRASOUND REPORT   Location: [facility]  Date of Service: 07/26/2021    Indications:Abnormal Uterine Bleeding Findings:  The uterus is anteverted and measures 7.7 x 6.0 x 4.0 cm. Echo texture is heterogenous without evidence of focal masses. There are several calcifications seen in the myometrium, largest measuring 2.3 mm. The Endometrium measures 2.4 mm, with a very small amount of swirling blood seen.    Right Ovary measures 1.9 x 2.6 x 3.2 cm. It is normal in appearance. Left Ovary measures 2.1 x 1.4 x 1.7 cm and contains a 1.3 cm simple cyst/dominant follicle. Survey of the adnexa demonstrates no adnexal masses. There is no free fluid in the cul de sac.   Impression: 1. No abnormality seen.   Recommendations: 1.Clinical correlation with the patient's History and Physical Exam.  Past Medical History:  Diagnosis Date   Family history of breast cancer    5/23 cancer genetic testing letter sent   Medical history non-contributory    Ovarian cyst    Past Surgical History:  Procedure Laterality Date   OVARIAN CYST REMOVAL Right    Family History  Problem Relation Age of Onset   Breast cancer Mother 4   Ovarian cancer Maternal Grandmother 52   Social History   Socioeconomic History   Marital status: Single    Spouse name: Chrissie Noa   Number of children: Not on file   Years of education: Not on file   Highest education level: Not on file  Occupational History   Not on file  Tobacco Use   Smoking status: Some Days    Packs/day: 0.50    Types: Cigarettes   Smokeless tobacco: Never  Vaping Use   Vaping Use: Never used  Substance and Sexual Activity   Alcohol use: Never   Drug use: Yes     Types: Marijuana   Sexual activity: Yes    Partners: Male    Birth control/protection: None    Comment: possible Vasectomy  Other Topics Concern   Not on file  Social History Narrative   Not on file   Social Determinants of Health   Financial Resource Strain: Not on file  Food Insecurity: Not on file  Transportation Needs: Not on file  Physical Activity: Not on file  Stress: Not on file  Social Connections: Not on file  Intimate Partner Violence: Not on file    Medicine list and allergies reviewed and updated.    BP 100/60   Wt 130 lb 9.6 oz (59.2 kg)   LMP 06/30/2021 (Exact Date)   BMI 23.89 kg/m  Physical Exam Vitals reviewed. Exam conducted with a chaperone present.  Constitutional:      General: She is not in acute distress.    Appearance: Normal appearance. She is not ill-appearing.  HENT:     Head: Normocephalic and atraumatic.  Eyes:     Extraocular Movements: Extraocular movements intact.  Cardiovascular:     Rate and Rhythm: Normal rate.  Pulmonary:     Effort: Pulmonary effort is normal. No respiratory distress.  Genitourinary:    General: Normal vulva.     Exam position: Lithotomy position.     Pubic Area: No rash.      Labia:  Right: No rash, tenderness or lesion.        Left: No rash, tenderness or lesion.      Vagina: Normal. No vaginal discharge, tenderness or lesions.     Cervix: No discharge, friability, lesion or cervical bleeding.     Uterus: Normal. Not enlarged, not tender and no uterine prolapse. Deviated: retroverted.     Adnexa:        Right: No tenderness or fullness.         Left: No tenderness or fullness.    Musculoskeletal:        General: Normal range of motion.  Lymphadenopathy:     Lower Body: No right inguinal adenopathy. No left inguinal adenopathy.  Skin:    General: Skin is warm.  Neurological:     General: No focal deficit present.     Mental Status: She is alert and oriented to person, place, and time. Mental  status is at baseline.     Coordination: Coordination normal.  Psychiatric:        Mood and Affect: Mood normal.        Behavior: Behavior normal.        Thought Content: Thought content normal.        Judgment: Judgment normal.   Procedure Note: Mirena IUD Insertion  Allis Clamp was applied to cervix.  Gentle downward traction applied.  Next endometrial cavity sounded to 8 cm using flexible curved sound.  Uterus noted to be in retroverted position using flexible sound.  IUD loaded into insertion instrument. Thereafter, IUD inserted in a single attempt without difficulty to the level of the fundus. Insertion instrument disengaged, repositioned and removed in standard fashion. Manipulator removed and silver nitrate applied as needed.  Strings trimmed to approximate length of 3 cm.  Procedure well tolerated. No apparent complications.  Encounter for insertion of mirena IUD  Menorrhagia with regular cycle - Plan: TSH Rfx on Abnormal to Free T4  Chronic blood loss anemia - Plan: Ambulatory referral to [facility], Ambulatory referral to Hematology / Oncology  Low serum calcium - Plan: Ambulatory referral to Family Practice  ASCUS of cervix with negative high risk HPV  IUD placed successfully today. Bleeding expectations discussed. Return warnings. [place] reviewed wnl Labs reviewed with low serum calcium and low hemoglobin. Has been anemic for years. Now with mirena, heavy bleeding should improve. Recommend hematology referral, done. Also needs PCP referral done today.  TSH never resulted, will rpt today.  ASCUS HPV neg - rpt pap 3 yrs Pt did not get results about BV - aware now her rx for flagyl at pharmacy. Flagyl warnings given.   Return in about 4 weeks (around 08/29/2021) for IUD check .

## 2021-08-01 NOTE — Addendum Note (Signed)
Addended by: Fonda Kinder on: 08/01/2021 04:25 PM   Modules accepted: Orders

## 2021-08-28 ENCOUNTER — Ambulatory Visit: Payer: Medicaid Other | Admitting: Obstetrics

## 2022-03-13 IMAGING — US US OB COMP +14 WK
1 of 2 series · 13 of 28 positions shown · non-contrast
Comparison: none

CLINICAL DATA: Second trimester pregnancy for fetal anatomy survey.

EXAM:
OBSTETRICAL ULTRASOUND >14 WKS

[Series 1: us ob comp +14 wk · 0.19mm/px · 13 of 90 slices shown]
[im 4/90]
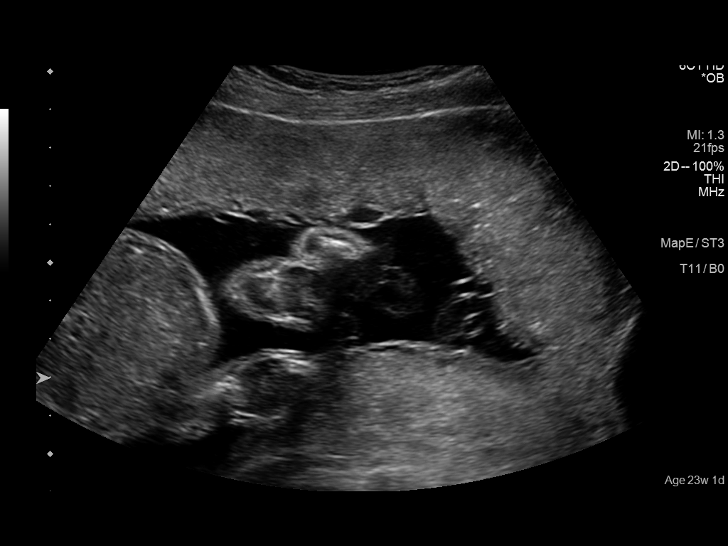
[im 11/90]
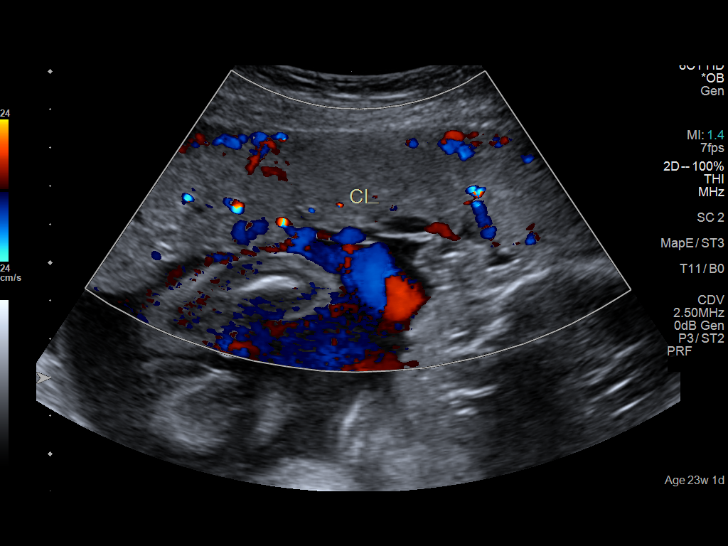
[im 18/90]
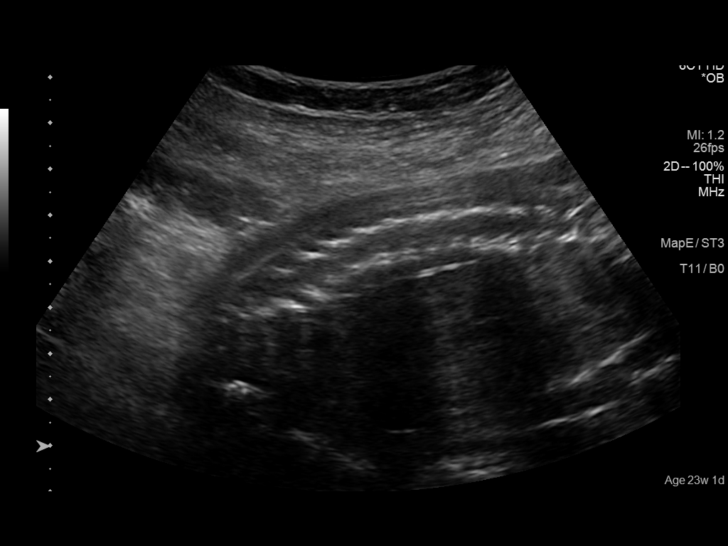
[im 24/90]
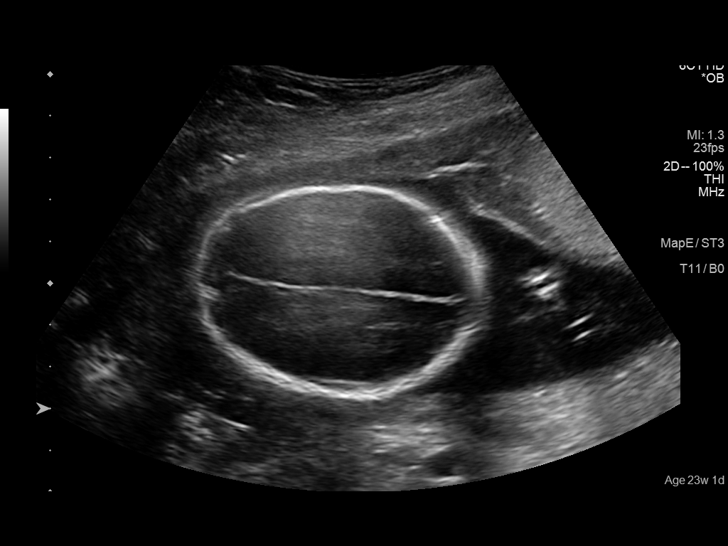
[im 31/90]
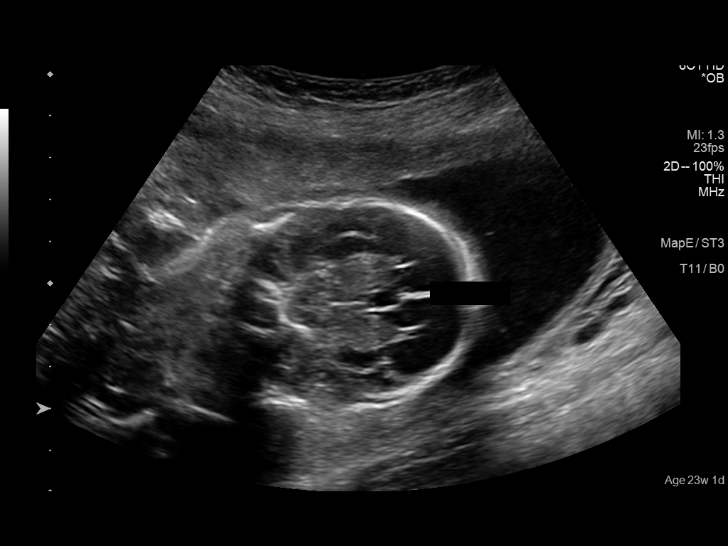
[im 38/90]
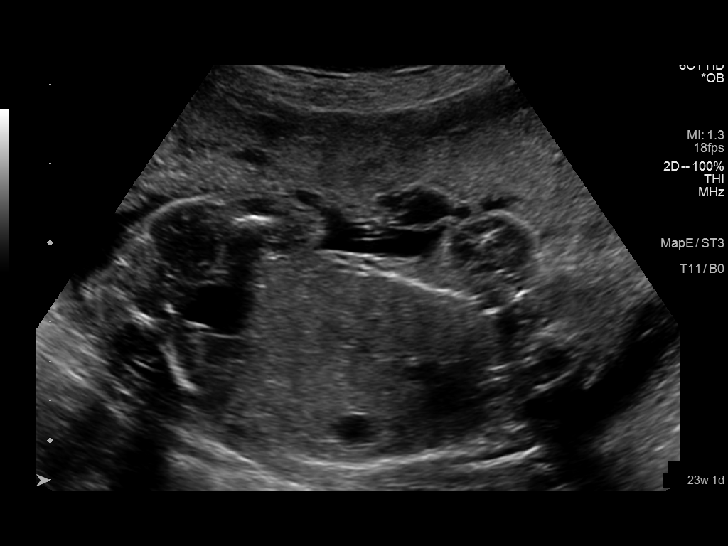
[im 48/90]
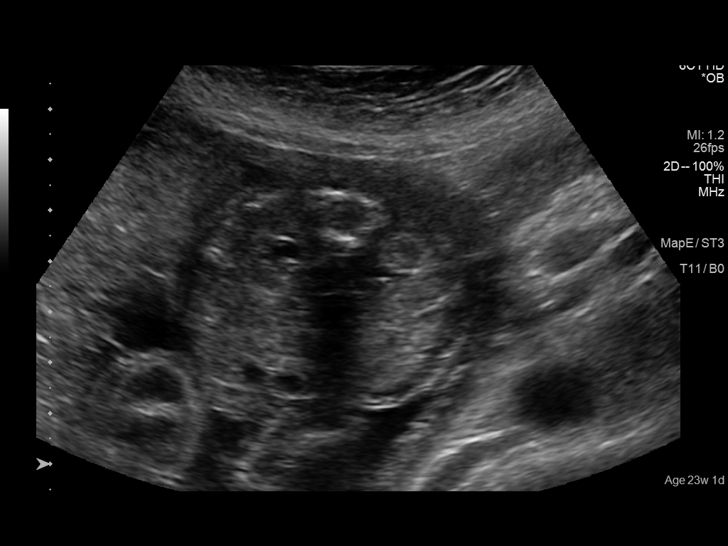
[im 55/90]
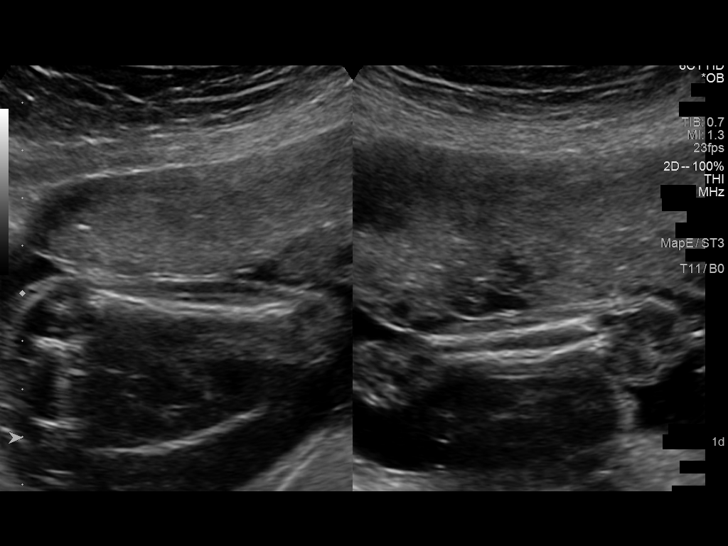
[im 62/90]
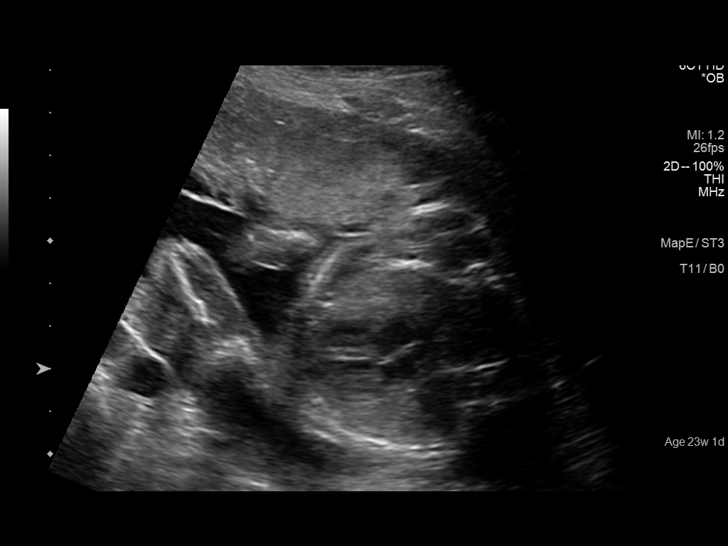
[im 69/90]
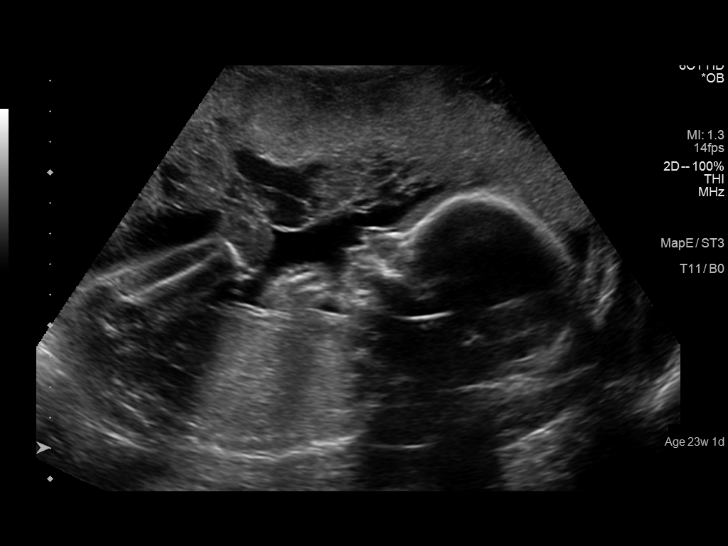
[im 76/90]
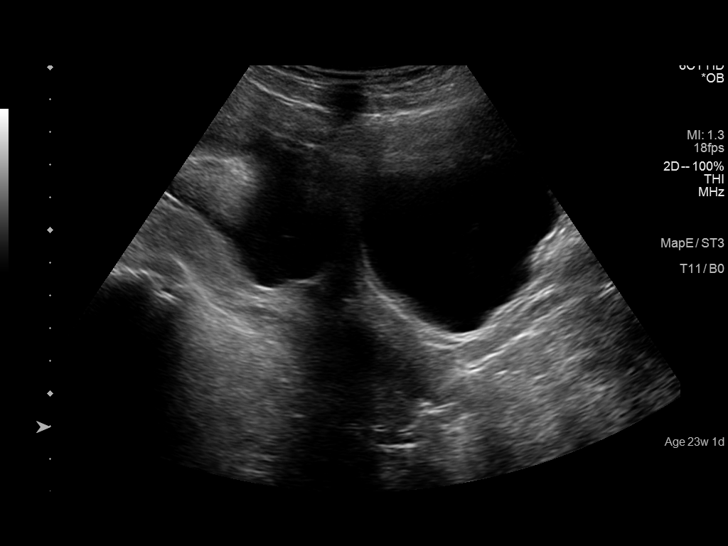
[im 83/90]
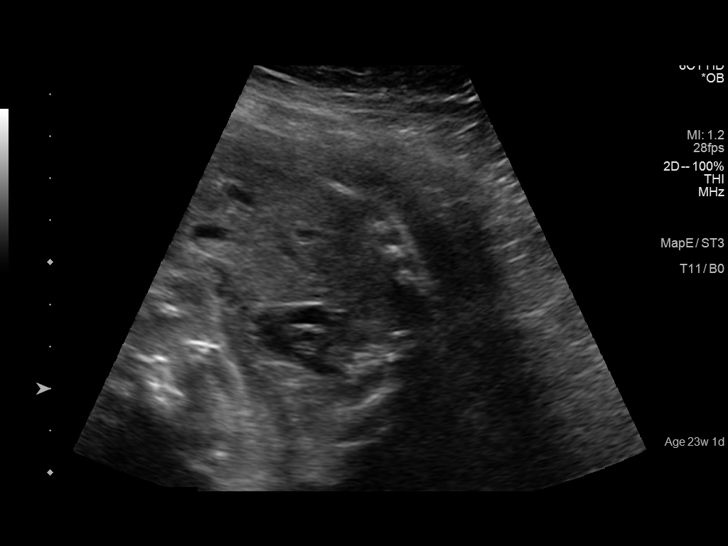
[im 90/90]
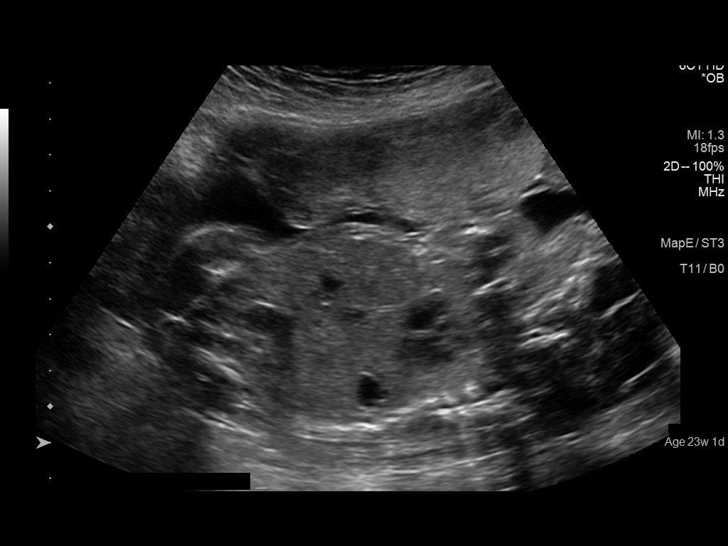

[13 of 28 positions shown; findings below may reference images not displayed]

FINDINGS: Number of Fetuses: 1

Heart Rate:  143 bpm

Movement: Yes

Presentation: Cephalic

Previa: No

Placental Location: Anterior

Amniotic Fluid (Subjective): Within normal limits

Amniotic Fluid (Objective):

Vertical pocket = 3.5cm

FETAL BIOMETRY

BPD: 5.3cm 22w 0d

HC:   20.5cm 22w 4d

AC:   18.8cm 23w 4d

FL:   4.3cm 24w 0d

Current Mean GA: 23w 0d US EDC: 11/08/2020

FETAL ANATOMY

Lateral Ventricles: Appears normal

Thalami/CSP: Appears normal

Posterior Fossa:  Appears normal

Nuchal Region: Appears normal   NFT= 3.5 mm

Upper Lip: Appears normal

Spine: Appears normal

4 Chamber Heart on Left: Appears normal

LVOT: Appears normal

RVOT: Appears normal

Stomach on Left: Appears normal

3 Vessel Cord: Appears normal

Cord Insertion site: Appears normal

Kidneys: Appears normal

Bladder: Appears normal

Extremities: Appears normal

Maternal Findings:

Cervix:  3.9 cm TA
IMPRESSION: Single living IUP with estimated gestational age of 23 weeks 0 days,
and US EDC of 11/08/2020.

Unremarkable fetal anatomic survey.  No fetal anomalies identified.
